# Patient Record
Sex: Female | Born: 1980 | Race: White | Hispanic: No | Smoking: Current every day smoker
Health system: Southern US, Community
[De-identification: ages and names within clinical notes are randomized; demographics above are authoritative.]

## PROBLEM LIST (undated history)

## (undated) DIAGNOSIS — D6861 Antiphospholipid syndrome: Secondary | ICD-10-CM

## (undated) DIAGNOSIS — F909 Attention-deficit hyperactivity disorder, unspecified type: Secondary | ICD-10-CM

## (undated) DIAGNOSIS — R87619 Unspecified abnormal cytological findings in specimens from cervix uteri: Secondary | ICD-10-CM

## (undated) DIAGNOSIS — IMO0002 Reserved for concepts with insufficient information to code with codable children: Secondary | ICD-10-CM

## (undated) HISTORY — DX: Antiphospholipid syndrome: D68.61

## (undated) HISTORY — DX: Unspecified abnormal cytological findings in specimens from cervix uteri: R87.619

## (undated) HISTORY — DX: Reserved for concepts with insufficient information to code with codable children: IMO0002

## (undated) HISTORY — DX: Attention-deficit hyperactivity disorder, unspecified type: F90.9

---
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
## 2001-02-12 ENCOUNTER — Inpatient Hospital Stay (HOSPITAL_COMMUNITY): Admission: AD | Admit: 2001-02-12 | Discharge: 2001-02-12 | Payer: Self-pay | Admitting: *Deleted

## 2001-10-01 ENCOUNTER — Emergency Department (HOSPITAL_COMMUNITY): Admission: EM | Admit: 2001-10-01 | Discharge: 2001-10-02 | Payer: Self-pay | Admitting: Emergency Medicine

## 2002-02-21 ENCOUNTER — Emergency Department (HOSPITAL_COMMUNITY): Admission: EM | Admit: 2002-02-21 | Discharge: 2002-02-21 | Payer: Self-pay | Admitting: Emergency Medicine

## 2003-05-11 ENCOUNTER — Emergency Department (HOSPITAL_COMMUNITY): Admission: EM | Admit: 2003-05-11 | Discharge: 2003-05-11 | Payer: Self-pay | Admitting: Family Medicine

## 2003-07-03 ENCOUNTER — Emergency Department (HOSPITAL_COMMUNITY): Admission: EM | Admit: 2003-07-03 | Discharge: 2003-07-03 | Payer: Self-pay | Admitting: Family Medicine

## 2004-03-23 DIAGNOSIS — D6861 Antiphospholipid syndrome: Secondary | ICD-10-CM

## 2004-03-23 HISTORY — DX: Antiphospholipid syndrome: D68.61

## 2004-04-22 ENCOUNTER — Other Ambulatory Visit: Admission: RE | Admit: 2004-04-22 | Discharge: 2004-04-22 | Payer: Self-pay | Admitting: Obstetrics and Gynecology

## 2004-06-06 ENCOUNTER — Ambulatory Visit (HOSPITAL_COMMUNITY): Admission: RE | Admit: 2004-06-06 | Discharge: 2004-06-06 | Payer: Self-pay | Admitting: Obstetrics and Gynecology

## 2004-08-16 ENCOUNTER — Inpatient Hospital Stay (HOSPITAL_COMMUNITY): Admission: AD | Admit: 2004-08-16 | Discharge: 2004-08-16 | Payer: Self-pay | Admitting: Obstetrics and Gynecology

## 2004-09-09 ENCOUNTER — Ambulatory Visit (HOSPITAL_COMMUNITY): Admission: RE | Admit: 2004-09-09 | Discharge: 2004-09-09 | Payer: Self-pay | Admitting: Obstetrics and Gynecology

## 2004-10-13 ENCOUNTER — Ambulatory Visit (HOSPITAL_COMMUNITY): Admission: RE | Admit: 2004-10-13 | Discharge: 2004-10-13 | Payer: Self-pay | Admitting: Obstetrics and Gynecology

## 2004-10-20 ENCOUNTER — Inpatient Hospital Stay (HOSPITAL_COMMUNITY): Admission: AD | Admit: 2004-10-20 | Discharge: 2004-10-23 | Payer: Self-pay | Admitting: Obstetrics and Gynecology

## 2004-10-20 ENCOUNTER — Ambulatory Visit (HOSPITAL_COMMUNITY): Admission: RE | Admit: 2004-10-20 | Discharge: 2004-10-20 | Payer: Self-pay | Admitting: Obstetrics and Gynecology

## 2004-11-04 ENCOUNTER — Ambulatory Visit: Payer: Self-pay | Admitting: Oncology

## 2004-11-04 ENCOUNTER — Inpatient Hospital Stay (HOSPITAL_COMMUNITY): Admission: AD | Admit: 2004-11-04 | Discharge: 2004-11-06 | Payer: Self-pay | Admitting: Obstetrics and Gynecology

## 2004-11-05 ENCOUNTER — Ambulatory Visit: Payer: Self-pay | Admitting: Oncology

## 2004-11-19 ENCOUNTER — Ambulatory Visit: Payer: Self-pay | Admitting: Oncology

## 2005-01-06 ENCOUNTER — Ambulatory Visit: Payer: Self-pay | Admitting: Oncology

## 2005-02-02 ENCOUNTER — Emergency Department (HOSPITAL_COMMUNITY): Admission: EM | Admit: 2005-02-02 | Discharge: 2005-02-03 | Payer: Self-pay | Admitting: Emergency Medicine

## 2005-02-23 ENCOUNTER — Ambulatory Visit: Payer: Self-pay | Admitting: Oncology

## 2005-04-20 ENCOUNTER — Ambulatory Visit: Payer: Self-pay | Admitting: Oncology

## 2005-04-21 ENCOUNTER — Ambulatory Visit: Payer: Self-pay | Admitting: Family Medicine

## 2005-05-14 ENCOUNTER — Ambulatory Visit (HOSPITAL_COMMUNITY): Admission: RE | Admit: 2005-05-14 | Discharge: 2005-05-14 | Payer: Self-pay | Admitting: Oncology

## 2005-05-18 ENCOUNTER — Ambulatory Visit: Payer: Self-pay | Admitting: Sports Medicine

## 2005-05-20 ENCOUNTER — Ambulatory Visit: Payer: Self-pay | Admitting: Family Medicine

## 2005-05-21 ENCOUNTER — Encounter (INDEPENDENT_AMBULATORY_CARE_PROVIDER_SITE_OTHER): Payer: Self-pay | Admitting: *Deleted

## 2005-06-01 ENCOUNTER — Other Ambulatory Visit: Admission: RE | Admit: 2005-06-01 | Discharge: 2005-06-01 | Payer: Self-pay | Admitting: Obstetrics and Gynecology

## 2005-07-13 ENCOUNTER — Ambulatory Visit: Payer: Self-pay | Admitting: Family Medicine

## 2005-11-05 ENCOUNTER — Ambulatory Visit: Payer: Self-pay | Admitting: Family Medicine

## 2005-11-08 ENCOUNTER — Emergency Department (HOSPITAL_COMMUNITY): Admission: EM | Admit: 2005-11-08 | Discharge: 2005-11-08 | Payer: Self-pay | Admitting: Emergency Medicine

## 2005-12-24 ENCOUNTER — Ambulatory Visit: Payer: Self-pay | Admitting: Family Medicine

## 2006-01-12 ENCOUNTER — Ambulatory Visit: Payer: Self-pay | Admitting: Family Medicine

## 2006-02-22 ENCOUNTER — Ambulatory Visit: Payer: Self-pay | Admitting: Family Medicine

## 2006-02-22 ENCOUNTER — Encounter: Payer: Self-pay | Admitting: Vascular Surgery

## 2006-02-22 ENCOUNTER — Inpatient Hospital Stay (HOSPITAL_COMMUNITY): Admission: RE | Admit: 2006-02-22 | Discharge: 2006-02-24 | Payer: Self-pay | Admitting: Sports Medicine

## 2006-02-22 ENCOUNTER — Ambulatory Visit: Payer: Self-pay | Admitting: Sports Medicine

## 2006-02-26 ENCOUNTER — Ambulatory Visit: Payer: Self-pay | Admitting: Family Medicine

## 2006-03-01 ENCOUNTER — Ambulatory Visit: Payer: Self-pay | Admitting: Family Medicine

## 2006-03-05 ENCOUNTER — Ambulatory Visit: Payer: Self-pay | Admitting: Sports Medicine

## 2006-03-10 ENCOUNTER — Ambulatory Visit: Payer: Self-pay | Admitting: Family Medicine

## 2006-03-12 ENCOUNTER — Ambulatory Visit: Payer: Self-pay | Admitting: Family Medicine

## 2006-03-18 ENCOUNTER — Ambulatory Visit: Payer: Self-pay | Admitting: Family Medicine

## 2006-03-19 ENCOUNTER — Ambulatory Visit: Payer: Self-pay | Admitting: Family Medicine

## 2006-04-05 ENCOUNTER — Ambulatory Visit: Payer: Self-pay | Admitting: Family Medicine

## 2006-04-15 ENCOUNTER — Emergency Department (HOSPITAL_COMMUNITY): Admission: EM | Admit: 2006-04-15 | Discharge: 2006-04-16 | Payer: Self-pay | Admitting: Emergency Medicine

## 2006-04-19 ENCOUNTER — Ambulatory Visit: Payer: Self-pay | Admitting: Family Medicine

## 2006-04-22 ENCOUNTER — Ambulatory Visit: Payer: Self-pay | Admitting: Family Medicine

## 2006-04-26 ENCOUNTER — Ambulatory Visit: Payer: Self-pay | Admitting: *Deleted

## 2006-05-10 ENCOUNTER — Ambulatory Visit: Payer: Self-pay | Admitting: Family Medicine

## 2006-05-17 ENCOUNTER — Ambulatory Visit: Payer: Self-pay | Admitting: Family Medicine

## 2006-05-21 ENCOUNTER — Encounter (INDEPENDENT_AMBULATORY_CARE_PROVIDER_SITE_OTHER): Payer: Self-pay | Admitting: *Deleted

## 2006-05-25 ENCOUNTER — Ambulatory Visit: Payer: Self-pay | Admitting: Family Medicine

## 2006-05-25 DIAGNOSIS — I80299 Phlebitis and thrombophlebitis of other deep vessels of unspecified lower extremity: Secondary | ICD-10-CM

## 2006-05-25 LAB — CONVERTED CEMR LAB: Prothrombin Time: 20.1 s

## 2006-06-07 ENCOUNTER — Ambulatory Visit: Payer: Self-pay | Admitting: Sports Medicine

## 2006-06-22 ENCOUNTER — Ambulatory Visit: Payer: Self-pay | Admitting: Family Medicine

## 2006-07-05 ENCOUNTER — Ambulatory Visit: Payer: Self-pay | Admitting: Family Medicine

## 2006-07-05 LAB — CONVERTED CEMR LAB: INR: 3.7

## 2006-07-15 ENCOUNTER — Ambulatory Visit: Payer: Self-pay | Admitting: Sports Medicine

## 2006-07-15 LAB — CONVERTED CEMR LAB: INR: 1.7

## 2006-07-19 ENCOUNTER — Ambulatory Visit: Payer: Self-pay | Admitting: Family Medicine

## 2006-07-19 ENCOUNTER — Encounter: Payer: Self-pay | Admitting: Family Medicine

## 2006-07-22 ENCOUNTER — Ambulatory Visit: Payer: Self-pay | Admitting: Sports Medicine

## 2006-07-22 ENCOUNTER — Encounter: Payer: Self-pay | Admitting: Family Medicine

## 2006-07-22 LAB — CONVERTED CEMR LAB
INR: 1.9
INR: 1.7 — ABNORMAL HIGH (ref 0.0–1.5)
Prothrombin Time: 20.5 s — ABNORMAL HIGH (ref 11.6–15.2)

## 2006-07-26 ENCOUNTER — Telehealth: Payer: Self-pay | Admitting: *Deleted

## 2006-08-04 ENCOUNTER — Encounter (INDEPENDENT_AMBULATORY_CARE_PROVIDER_SITE_OTHER): Payer: Self-pay | Admitting: *Deleted

## 2006-08-04 ENCOUNTER — Ambulatory Visit: Payer: Self-pay | Admitting: *Deleted

## 2006-08-06 ENCOUNTER — Ambulatory Visit (HOSPITAL_COMMUNITY): Admission: RE | Admit: 2006-08-06 | Discharge: 2006-08-06 | Payer: Self-pay | Admitting: Family Medicine

## 2006-08-09 ENCOUNTER — Ambulatory Visit: Payer: Self-pay | Admitting: *Deleted

## 2006-08-09 ENCOUNTER — Ambulatory Visit (HOSPITAL_COMMUNITY): Admission: RE | Admit: 2006-08-09 | Discharge: 2006-08-09 | Payer: Self-pay | Admitting: Family Medicine

## 2006-08-17 ENCOUNTER — Ambulatory Visit (HOSPITAL_COMMUNITY): Admission: RE | Admit: 2006-08-17 | Discharge: 2006-08-17 | Payer: Self-pay | Admitting: Gynecology

## 2006-08-17 ENCOUNTER — Encounter (INDEPENDENT_AMBULATORY_CARE_PROVIDER_SITE_OTHER): Payer: Self-pay | Admitting: Gynecology

## 2006-08-17 ENCOUNTER — Ambulatory Visit: Payer: Self-pay | Admitting: Gynecology

## 2006-09-19 IMAGING — US US OB LIMITED
1 series · 13 of 28 positions shown · non-contrast
Comparison: none

CLINICAL DATA: Assess biophysical profile and amniotic fluid index.

[Series 1: us ob limited · 0.33mm/px · 13 of 34 slices shown]
[im 2/34]
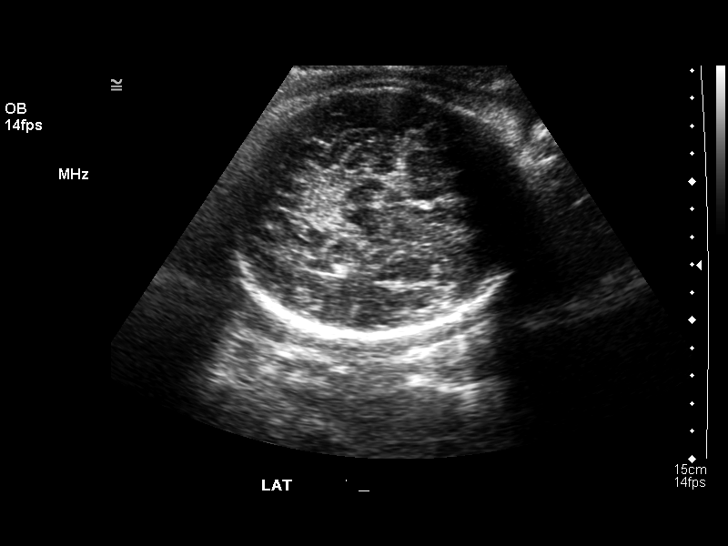
[im 4/34]
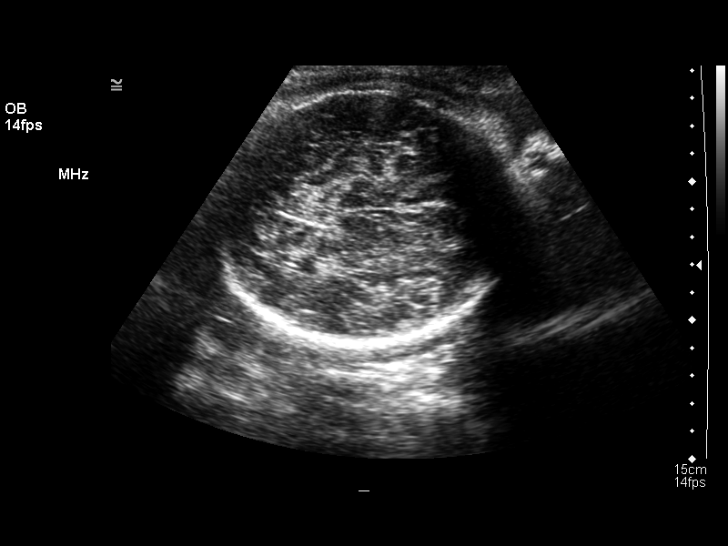
[im 7/34]
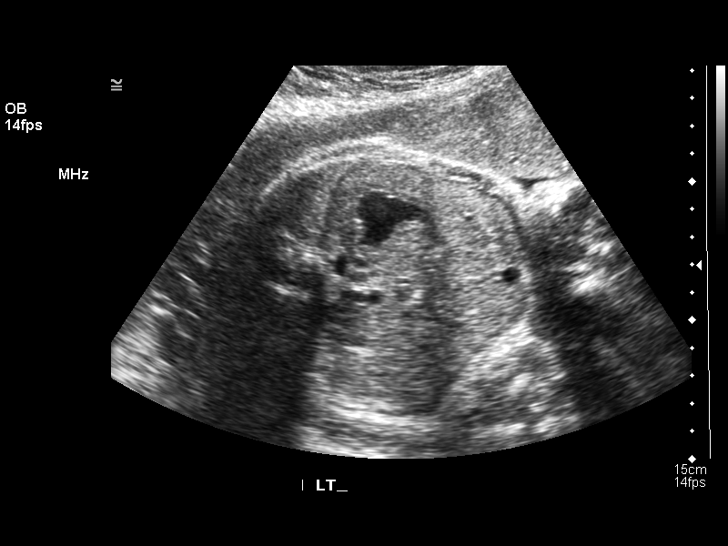
[im 9/34]
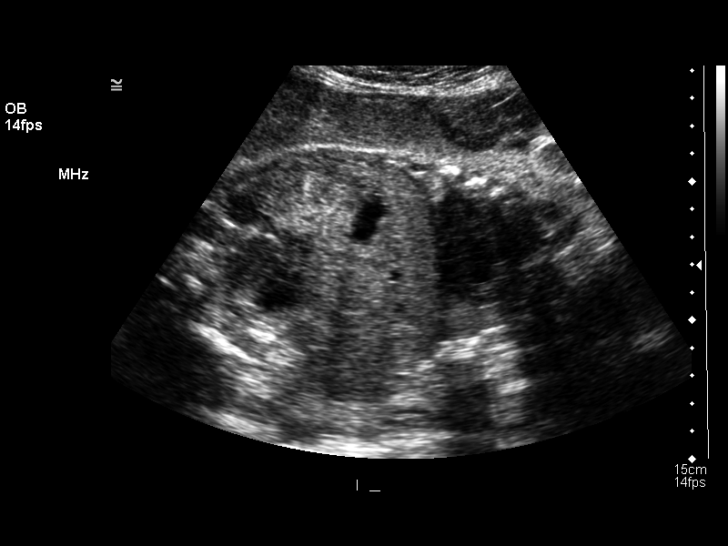
[im 12/34]
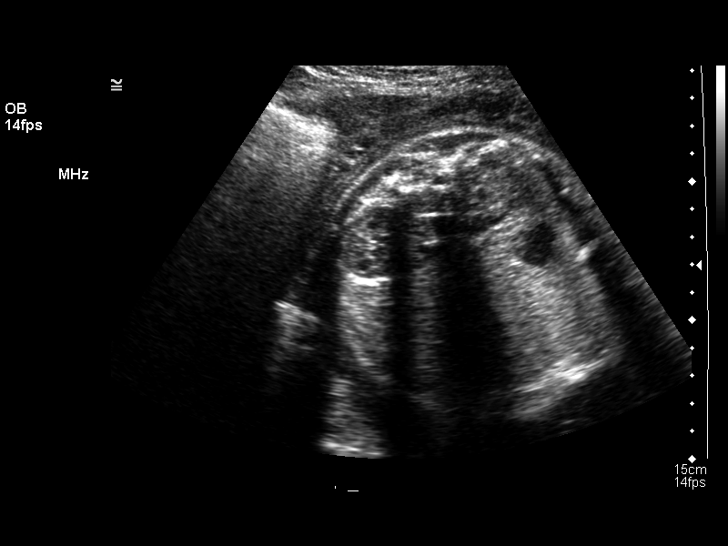
[im 14/34]
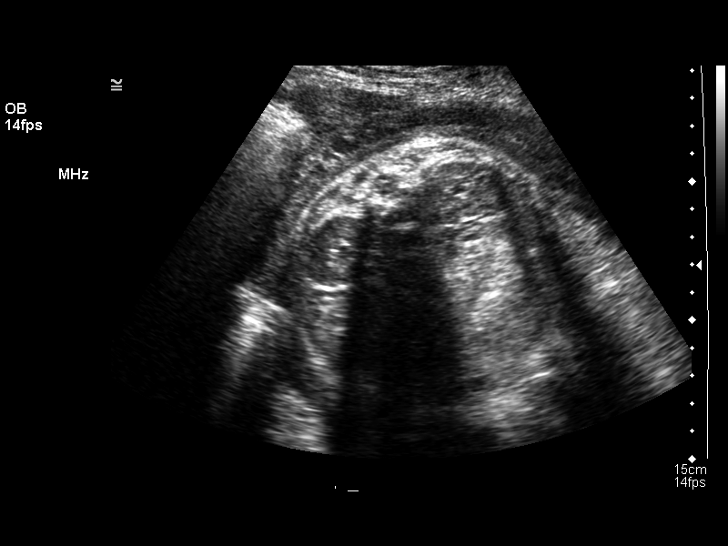
[im 18/34]
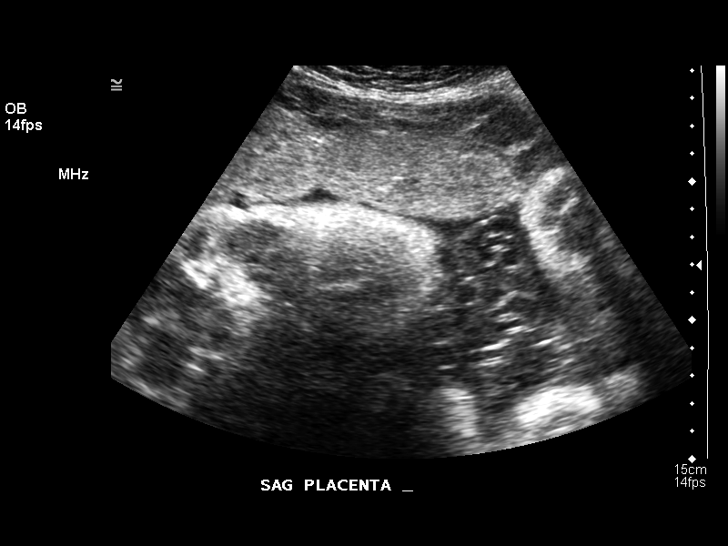
[im 20/34]
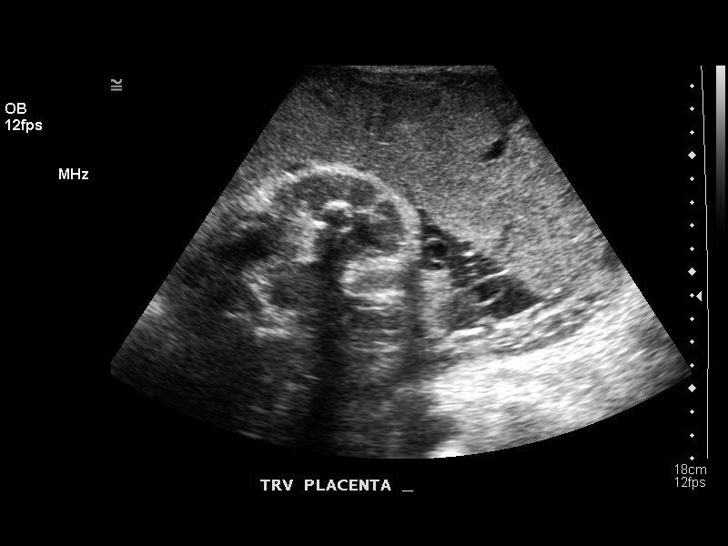
[im 23/34]
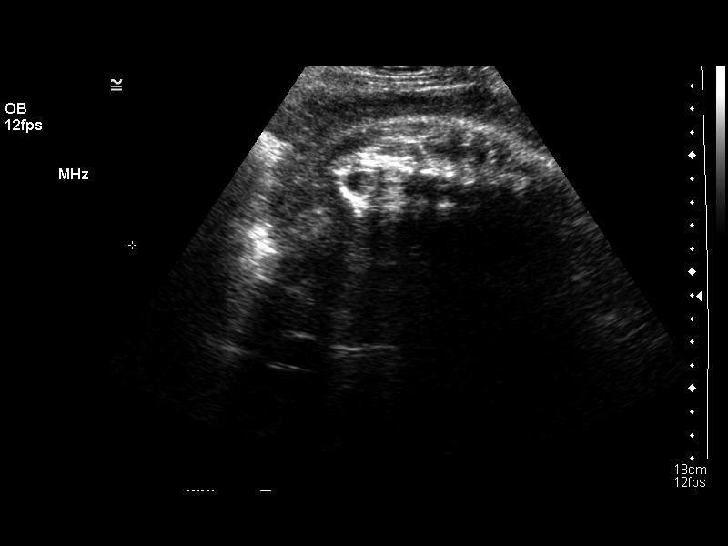
[im 25/34]
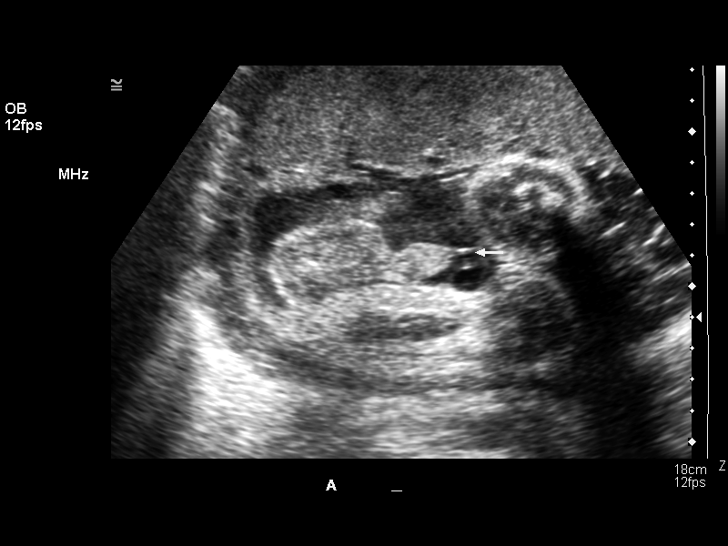
[im 27/34]
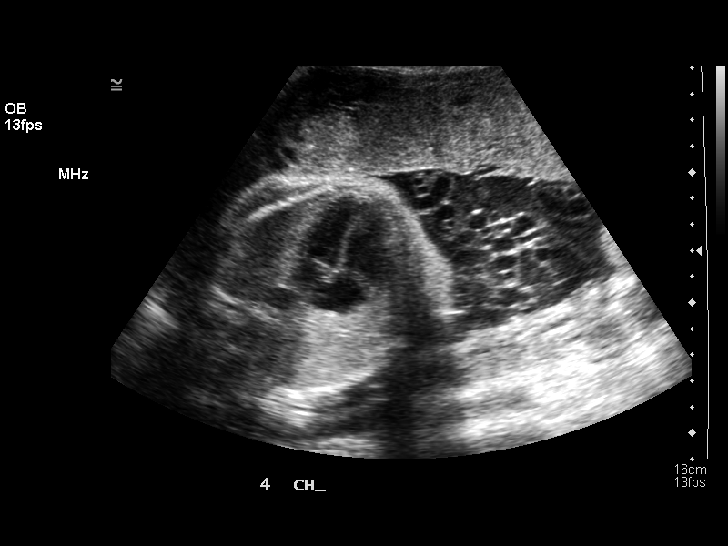
[im 30/34]
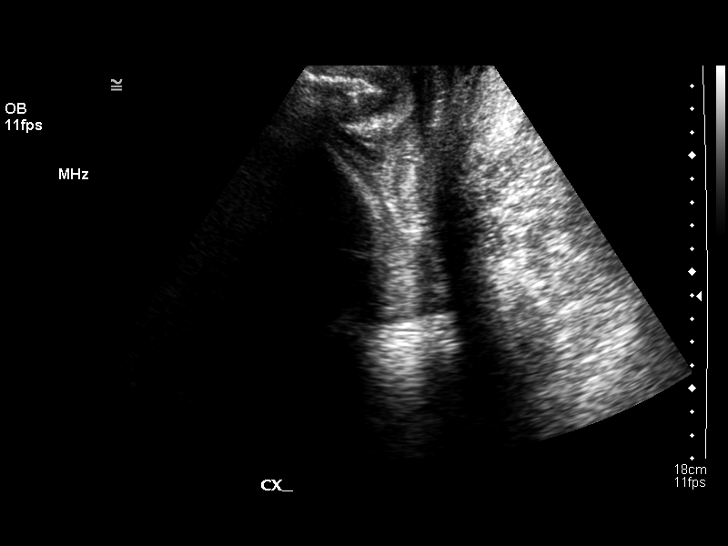
[im 32/34]
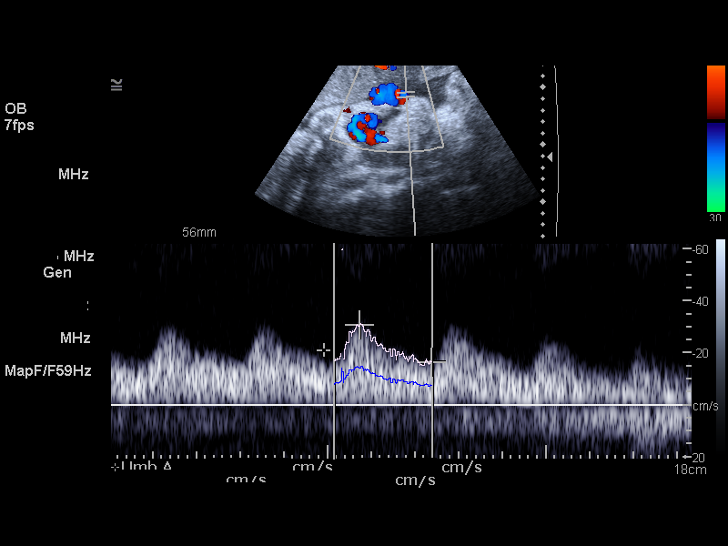

[13 of 28 positions shown; findings below may reference images not displayed]

LIMITED OBSTETRICAL ULTRASOUND:
Number of Fetuses: 1
Heart Rate:  136
Movement:  Yes
Breathing:  Yes
Presentation:  Cephalic
Placental Location:  Anterior
Grade:  I
Previa:  No
Amniotic Fluid (Subjective): Decreased
Amniotic Fluid (Objective):  7.7 cm AFI (5th -95th%ile = 7.7 ? 24.9 cm for 36 wks)

Fetal measurements and complete anatomic evaluation were not requested.  The following fetal anatomy was visualized during this exam:  Lateral ventricles, thalami, four chamber heart, stomach, kidneys, and bladder.  

MATERNAL UTERINE AND ADNEXAL FINDINGS
Cervix:  3.0 cm Translabially

BIOPHYSICAL PROFILE

Movement:  2      Time:  20 minutes 
Breathing:  2
Tone:  2
Amniotic Fluid:  2

Total Score:  8

DOPPLER ULTRASOUND OF FETUS:

Umbilical Artery S/D Ratio:  1.80    (NL< 3.33)
IMPRESSION: 1.  Subjectively and quantitatively slightly decreased amniotic fluid with an amniotic fluid index measured right at the 5th percentile for a 36 week gestation at 7.7 cm.  Because of today?s amniotic fluid volume, an umbilical artery S/D ratio was performed.
2.  Biophysical profile score [DATE].
3.  Normal umbilical artery S/D ratio.  No absence or reversal of end diastolic flow was appreciated.  
4.  No late developing fetal anatomic abnormalities are identified associated with the lateral ventricles, four chamber heart, stomach, kidneys or bladder.  
5.  Normal cervical length.

## 2006-09-22 ENCOUNTER — Ambulatory Visit: Payer: Self-pay | Admitting: Obstetrics & Gynecology

## 2006-09-29 ENCOUNTER — Ambulatory Visit: Payer: Self-pay | Admitting: Obstetrics & Gynecology

## 2006-12-24 ENCOUNTER — Encounter: Payer: Self-pay | Admitting: Family Medicine

## 2006-12-24 ENCOUNTER — Ambulatory Visit: Payer: Self-pay | Admitting: Family Medicine

## 2006-12-24 ENCOUNTER — Telehealth (INDEPENDENT_AMBULATORY_CARE_PROVIDER_SITE_OTHER): Payer: Self-pay | Admitting: *Deleted

## 2006-12-24 DIAGNOSIS — R5381 Other malaise: Secondary | ICD-10-CM | POA: Insufficient documentation

## 2006-12-24 DIAGNOSIS — R5383 Other fatigue: Secondary | ICD-10-CM

## 2006-12-24 LAB — CONVERTED CEMR LAB
HCT: 36.6 % (ref 36.0–46.0)
Platelets: 192 10*3/uL (ref 150–400)
RDW: 11.7 % (ref 11.5–14.0)
TSH: 0.64 microintl units/mL (ref 0.350–5.50)
WBC: 6.6 10*3/uL (ref 4.0–10.5)

## 2007-01-10 ENCOUNTER — Ambulatory Visit: Payer: Self-pay | Admitting: Sports Medicine

## 2007-01-10 DIAGNOSIS — F17200 Nicotine dependence, unspecified, uncomplicated: Secondary | ICD-10-CM

## 2007-01-13 ENCOUNTER — Encounter: Payer: Self-pay | Admitting: *Deleted

## 2007-01-17 ENCOUNTER — Encounter (INDEPENDENT_AMBULATORY_CARE_PROVIDER_SITE_OTHER): Payer: Self-pay | Admitting: *Deleted

## 2007-07-01 ENCOUNTER — Emergency Department (HOSPITAL_COMMUNITY): Admission: EM | Admit: 2007-07-01 | Discharge: 2007-07-01 | Payer: Self-pay | Admitting: Emergency Medicine

## 2007-10-06 ENCOUNTER — Encounter: Payer: Self-pay | Admitting: Obstetrics and Gynecology

## 2007-10-06 ENCOUNTER — Ambulatory Visit: Payer: Self-pay | Admitting: Obstetrics and Gynecology

## 2007-10-10 ENCOUNTER — Ambulatory Visit (HOSPITAL_COMMUNITY): Admission: RE | Admit: 2007-10-10 | Discharge: 2007-10-10 | Payer: Self-pay | Admitting: Family Medicine

## 2007-11-16 ENCOUNTER — Ambulatory Visit: Payer: Self-pay | Admitting: Obstetrics and Gynecology

## 2007-11-17 ENCOUNTER — Other Ambulatory Visit: Admission: RE | Admit: 2007-11-17 | Discharge: 2007-11-17 | Payer: Self-pay | Admitting: Gynecology

## 2007-11-30 ENCOUNTER — Ambulatory Visit: Payer: Self-pay | Admitting: Obstetrics & Gynecology

## 2008-02-01 ENCOUNTER — Ambulatory Visit: Payer: Self-pay | Admitting: Obstetrics & Gynecology

## 2008-03-12 ENCOUNTER — Emergency Department (HOSPITAL_COMMUNITY): Admission: EM | Admit: 2008-03-12 | Discharge: 2008-03-13 | Payer: Self-pay | Admitting: Emergency Medicine

## 2008-03-22 IMAGING — CT CT ANGIO CHEST
3 of 5 series · 18 of 30 positions shown · IV contrast (100 ML OMNI 300)
Comparison: none

CLINICAL DATA: Chest pain and shortness of breath.  Lower extremity DVT.  Clinical suspicion pulmonary embolism.  
 CT ANGIOGRAPHY OF CHEST:
TECHNIQUE: Multidetector CT imaging of the chest was performed during bolus injection of intravenous contrast.  Multiplanar CT angiographic image reconstructions were generated to evaluate the vascular anatomy.
 Contrast:  100 cc Omnipaque 300.

[Series 2: pe · axial · 0.73mm/px · z∈[-271,-40]mm · 9 of 239 slices shown]
[im 27/239  lung]
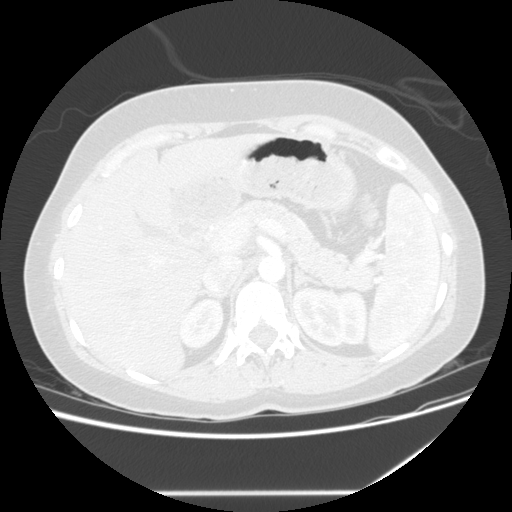
[im 53/239  mediastinal]
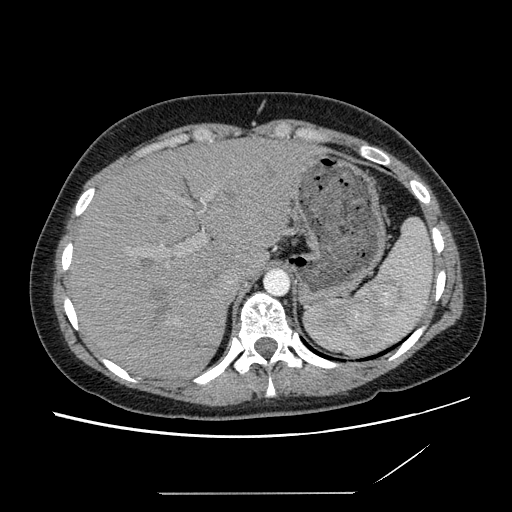
[im 80/239  lung]
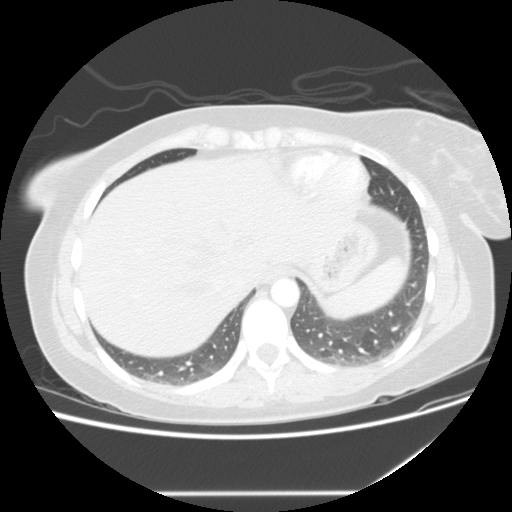
[im 106/239  mediastinal]
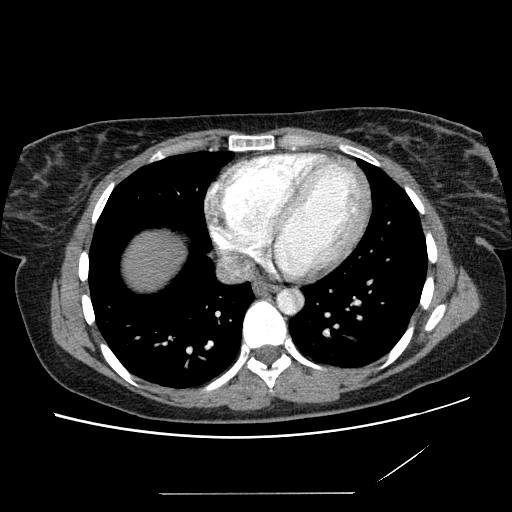
[im 127/239  lung]
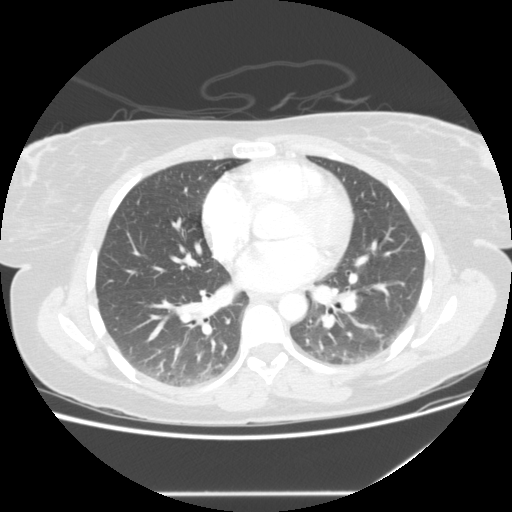
[im 133/239  mediastinal]
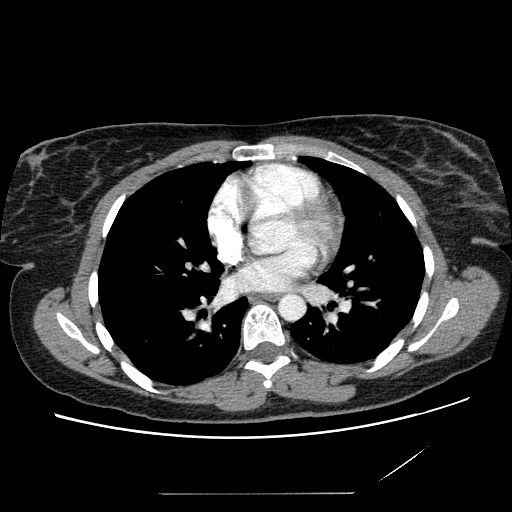
[im 159/239  lung]
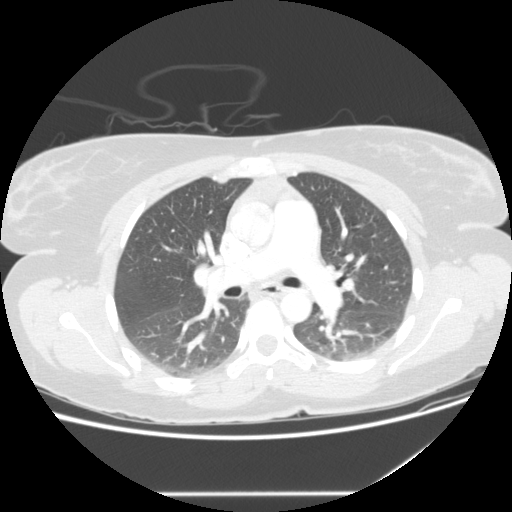
[im 186/239  mediastinal]
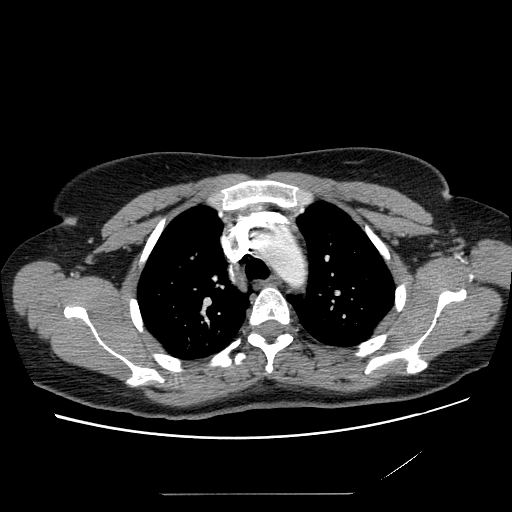
[im 212/239  lung]
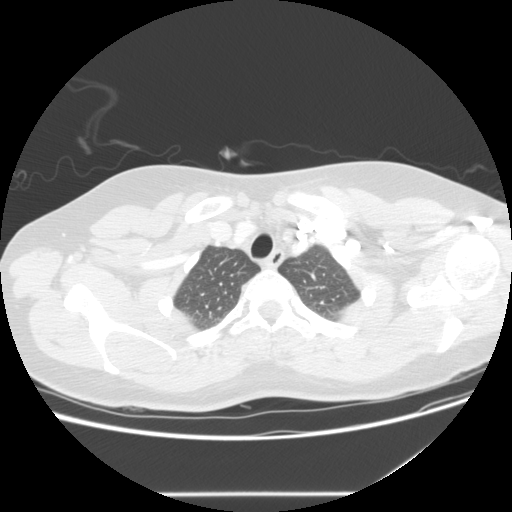

[Series 3: recon 2: pe · axial · 0.73mm/px · z∈[-231,-81]mm · 4 of 120 slices shown]
[im 30/120  lung]
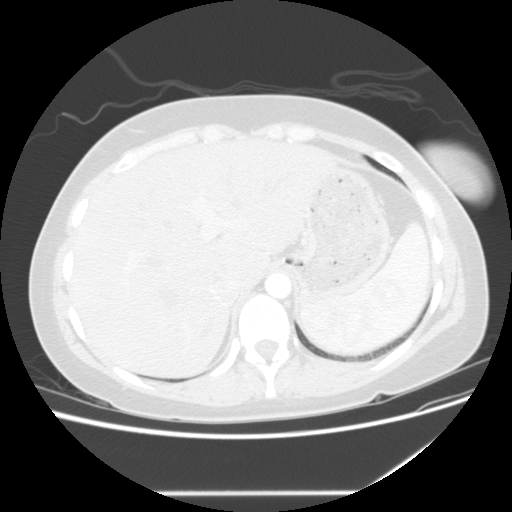
[im 60/120  lung]
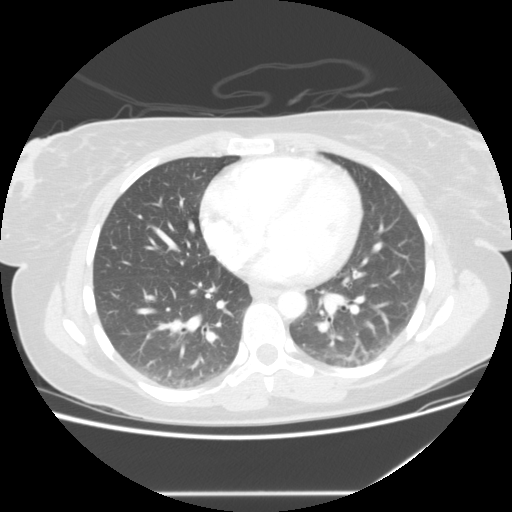
[im 64/120  lung]
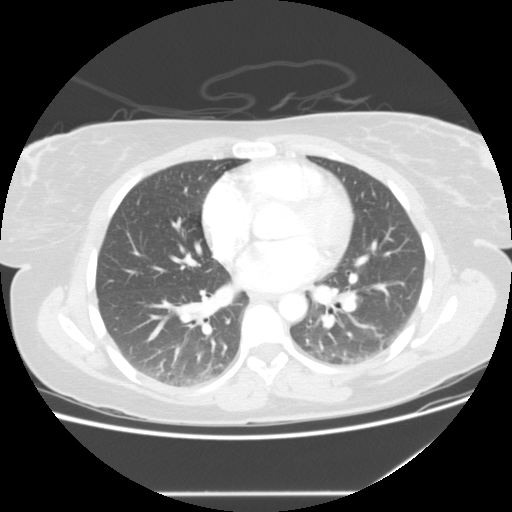
[im 90/120  lung]
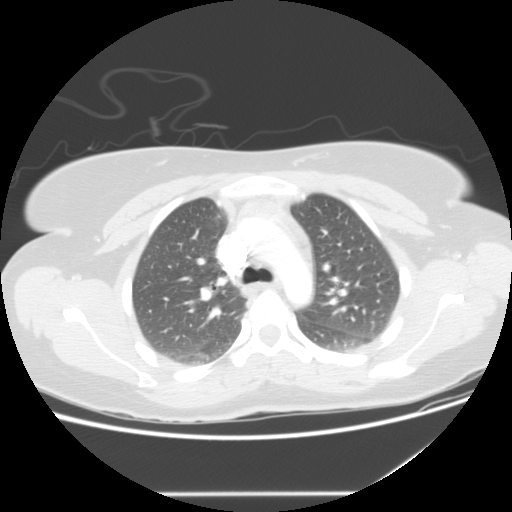

[Series 201: reformatted · sagittal · 0.73mm/px · 5 of 165 slices shown]
[im 28/165  lung]
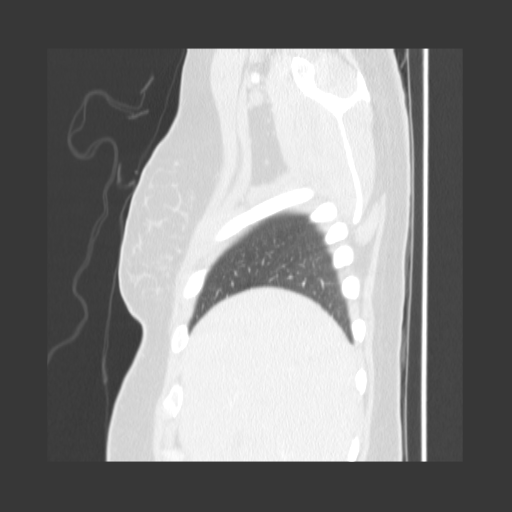
[im 55/165  lung]
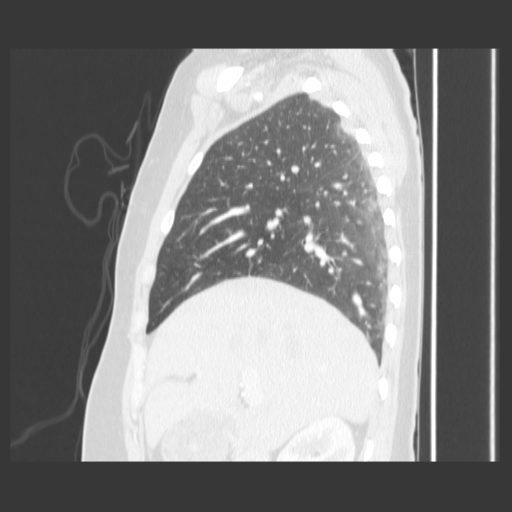
[im 83/165  lung]
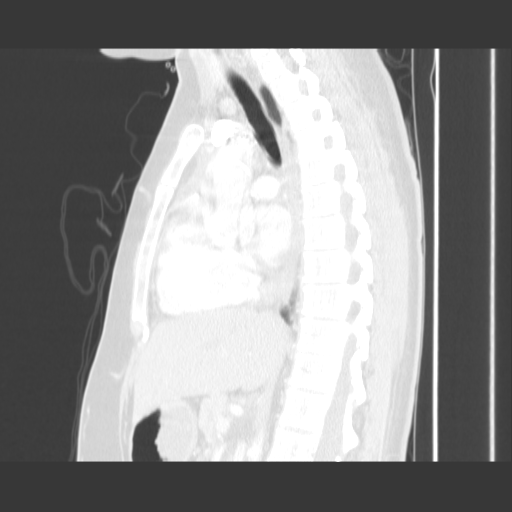
[im 110/165  lung]
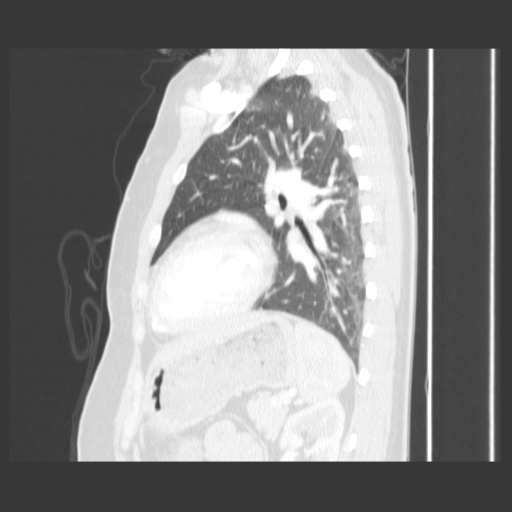
[im 137/165  lung]
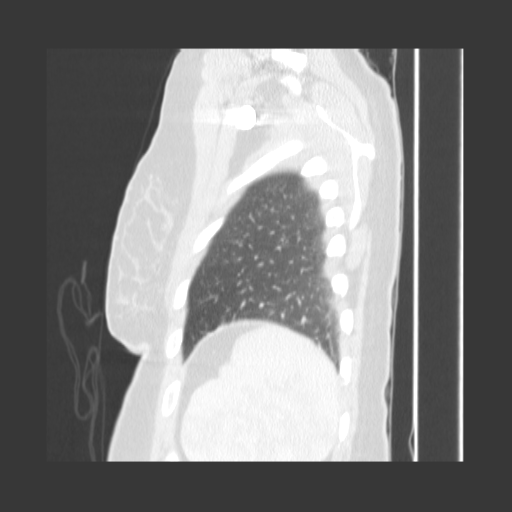

[18 of 30 positions shown; findings below may reference images not displayed]

FINDINGS: Satisfactory opacification of the pulmonary arteries is seen, and there is no evidence of pulmonary embolism.  There is no evidence of thoracic aortic aneurysm or dissection.  There is no evidence of hilar or mediastinal masses.
 There is no evidence of pleural or pericardial effusion.  Both lungs are clear.  There is no evidence of infiltrate or mass.
IMPRESSION: No evidence of pulmonary embolism.  No active disease.

## 2008-06-06 ENCOUNTER — Ambulatory Visit: Payer: Self-pay | Admitting: Obstetrics & Gynecology

## 2008-06-06 ENCOUNTER — Encounter: Payer: Self-pay | Admitting: Obstetrics and Gynecology

## 2008-06-06 ENCOUNTER — Encounter: Payer: Self-pay | Admitting: Obstetrics & Gynecology

## 2008-06-06 LAB — CONVERTED CEMR LAB
HCV Ab: NEGATIVE
Trich, Wet Prep: NONE SEEN
Yeast Wet Prep HPF POC: NONE SEEN

## 2009-02-14 ENCOUNTER — Inpatient Hospital Stay (HOSPITAL_COMMUNITY): Admission: AD | Admit: 2009-02-14 | Discharge: 2009-02-14 | Payer: Self-pay | Admitting: Obstetrics and Gynecology

## 2009-08-30 ENCOUNTER — Emergency Department (HOSPITAL_COMMUNITY): Admission: EM | Admit: 2009-08-30 | Discharge: 2009-08-31 | Payer: Self-pay | Admitting: Emergency Medicine

## 2009-09-03 ENCOUNTER — Inpatient Hospital Stay (HOSPITAL_COMMUNITY): Admission: AD | Admit: 2009-09-03 | Discharge: 2009-09-03 | Payer: Self-pay | Admitting: Obstetrics & Gynecology

## 2009-09-03 ENCOUNTER — Ambulatory Visit: Payer: Self-pay | Admitting: Nurse Practitioner

## 2009-09-06 ENCOUNTER — Ambulatory Visit: Payer: Self-pay | Admitting: Family Medicine

## 2009-09-06 ENCOUNTER — Encounter: Payer: Self-pay | Admitting: Family Medicine

## 2009-09-06 DIAGNOSIS — M2550 Pain in unspecified joint: Secondary | ICD-10-CM | POA: Insufficient documentation

## 2009-09-06 DIAGNOSIS — D6859 Other primary thrombophilia: Secondary | ICD-10-CM

## 2009-09-06 LAB — CONVERTED CEMR LAB
Albumin: 4.3 g/dL (ref 3.5–5.2)
Anti Nuclear Antibody(ANA): POSITIVE — AB
BUN: 17 mg/dL (ref 6–23)
CO2: 22 meq/L (ref 19–32)
Glucose, Bld: 87 mg/dL (ref 70–99)
Hemoglobin: 12.6 g/dL (ref 12.0–15.0)
MCV: 92 fL (ref 78.0–100.0)
RBC: 4 M/uL (ref 3.87–5.11)
Rhuematoid fact SerPl-aCnc: <20 intl units/mL (ref 0–20)
Sodium: 139 meq/L (ref 135–145)
TSH: 0.671 microintl units/mL (ref 0.350–4.500)
Total Bilirubin: 0.4 mg/dL (ref 0.3–1.2)
Total Protein: 7.2 g/dL (ref 6.0–8.3)
WBC: 6.3 10*3/uL (ref 4.0–10.5)

## 2009-09-09 ENCOUNTER — Encounter: Payer: Self-pay | Admitting: Family Medicine

## 2010-02-11 ENCOUNTER — Ambulatory Visit: Payer: Self-pay | Admitting: Family Medicine

## 2010-02-11 ENCOUNTER — Encounter: Payer: Self-pay | Admitting: *Deleted

## 2010-02-11 DIAGNOSIS — N10 Acute tubulo-interstitial nephritis: Secondary | ICD-10-CM

## 2010-02-11 LAB — CONVERTED CEMR LAB
Glucose, Urine, Semiquant: NEGATIVE
Ketones, urine, test strip: NEGATIVE
Protein, U semiquant: >=300
Urobilinogen, UA: 0.2

## 2010-02-12 ENCOUNTER — Encounter: Payer: Self-pay | Admitting: Family Medicine

## 2010-03-03 ENCOUNTER — Ambulatory Visit: Payer: Self-pay

## 2010-03-26 ENCOUNTER — Ambulatory Visit: Admit: 2010-03-26 | Payer: Self-pay

## 2010-04-13 ENCOUNTER — Encounter: Payer: Self-pay | Admitting: Obstetrics and Gynecology

## 2010-04-22 NOTE — Assessment & Plan Note (Signed)
Summary: uti?,df   Vital Signs:  Patient profile:   30 year old female Weight:      170.3 pounds Temp:     98.9 degrees F oral Pulse rate:   82 / minute BP sitting:   116 / 77  (left arm) Cuff size:   regular  Vitals Entered By: Jimmy Footman, CMA (February 11, 2010 10:46 AM) CC: UTI sxs x2days Is Patient Diabetic? No Pain Assessment Patient in pain? yes        Primary Care Provider:  . [facility]  CC:  UTI sxs x2days.  History of Present Illness: 30 yo here for work in appt for 2 day history of urinary symptoms  fever, bilateral flank pain, urinary frequency, dysuria.  No abd pain, some lower pelvic pain.  NO recent abd use, no history of frequent UTI's.    Habits & Providers  Alcohol-Tobacco-Diet     Tobacco Status: current  Physical Exam  General:  Well-developed,well-nourished,in no acute distress; alert,appropriate and cooperative throughout examination Abdomen:  Bowel sounds positive,abdomen soft and non-tender.  Mild pelvic pain, moderate bilateral flank pain.   Impression & Recommendations:  Problem # 1:  PYELONEPHRITIS, ACUTE (ICD-590.10) will treat with 14 days of keflex + pyridium + vicodin as needed.  Will send for culture.   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  Discussed with her taht I felt it was important to be on antioagulation.  She did not want to start today, but agreed to make return visit to establish with PCP.  Orders: [facility]- Est Level  3 ([address])  Complete Medication List: 1)  Aspirin 81 Mg Tbec (Aspirin) .... Daily 2)  Cephalexin 500 Mg Caps (Cephalexin) .... One tab three times a day for 14 days 3)  Pyridium 100 Mg Tabs (Phenazopyridine hcl) .... One tablet three times a day for urinary pain 4)  Vicodin 5-500 Mg Tabs (Hydrocodone-acetaminophen) .... One tablet every 6 hours as needed for pain  Other  Orders: Urinalysis-[facility] (00000)  Patient Instructions: 1)  return if youre having worsening pain, fever, unable to keep down liquids 2)  Make appointment with Dr, Denyse Amass or Alvester Morin to discuss your health Prescriptions: PYRIDIUM 100 MG TABS (PHENAZOPYRIDINE HCL) one tablet three times a day for urinary pain  #30 x 0   Entered and Authorized by:   [name] MD   Signed by:   [name] MD on 02/11/2010   Method used:   Print then Give to Patient   RxID:   [id number] VICODIN 5-500 MG TABS (HYDROCODONE-ACETAMINOPHEN) one tablet every 6 hours as needed for pain  #15 x 0   Entered and Authorized by:   [name] MD   Signed by:   [name] MD on 02/11/2010   Method used:   Print then Give to Patient   RxID:   [id number] CEPHALEXIN 500 MG CAPS (CEPHALEXIN) one tab three times a day for 14 days  #42 x 0   Entered and Authorized by:   [name] MD   Signed by:   [name] MD on 02/11/2010   Method used:   Electronically to        [facility] [phone]* (retail)       [address]       [place], [place]  [address]       Ph: [phone]  Fax: [phone]   RxID:   [id number]    Orders Added: 1)  Urinalysis-[facility] [00000] 2)  Urine Culture-[facility] [[id number]] 3)  [facility]- Est Level  3 [53664]    Laboratory Results   Urine Tests  Date/Time Received: February 11, 2010 10:30 AM  Date/Time Reported: February 11, 2010 10:55 AM   Routine Urinalysis   Color: yellow Appearance: Cloudy Glucose: negative   (Normal Range: Negative) Bilirubin: negative   (Normal Range: Negative) Ketone: negative   (Normal Range: Negative) Spec. Gravity: 1.025   (Normal Range: 1.003-1.035) Blood: large   (Normal Range: Negative) pH: 6.0   (Normal Range: 5.0-8.0) Protein: >=300   (Normal Range: Negative) Urobilinogen: 0.2   (Normal Range: 0-1) Nitrite: positive   (Normal Range: Negative) Leukocyte Esterace: moderate   (Normal Range: Negative)  Urine Microscopic WBC/HPF:  20+ RBC/HPF: 10-15 Bacteria/HPF: 2+ Epithelial/HPF: 1-5    Comments: ...........test performed by...........Marland KitchenTerese Door, CMA

## 2010-04-22 NOTE — Letter (Signed)
Summary: Out of School  [facility]  [address]   [place], [place] [address]   Phone: [phone]  Fax: [phone]    February 11, 2010   Student:  Valerie Higgins    To Whom It May Concern:   For Medical reasons, please excuse the above named student from school for the following dates:  February 11, 2010  If you need additional information, please feel free to contact our office.   Sincerely,    Jimmy Footman, CMA for [name], MD    ****This is a legal document and cannot be tampered with.  Schools are authorized to verify all information and to do so accordingly.

## 2010-04-22 NOTE — Letter (Signed)
Summary: Letter was not mailed to pt  [facility]  [address]   [place], [place] [address]   Phone: [phone]  Fax: [phone]    09/09/2009  Valerie Higgins [address] [place], [place]  [address]  Dear Ms. Arduini,  All of your labs are normal, there is no indication of any chronic disease that concerned you.  Please follow up as needed.  It is known that you do have antiphospholipid antibody condition that warrants long term treatment wtih coumadin.  I recommend you resume you follow up with your hematologist for this serious condition.  If you would have any symptoms of a clot in your leg, you should seek medical attention immediately.     Sincerely,   Luretha Murphy NP  Appended Document: Generic Letter mailed  Appended Document: Generic Letter letter NOT mailed per Trey Sailors request
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
## 2010-05-14 ENCOUNTER — Encounter: Payer: Self-pay | Admitting: *Deleted

## 2010-06-09 LAB — URINALYSIS, ROUTINE W REFLEX MICROSCOPIC
Glucose, UA: NEGATIVE mg/dL
Ketones, ur: NEGATIVE mg/dL
Leukocytes, UA: NEGATIVE
Specific Gravity, Urine: >1.03 — ABNORMAL HIGH (ref 1.005–1.030)
pH: 5.5 (ref 5.0–8.0)

## 2010-06-09 LAB — URINE MICROSCOPIC-ADD ON

## 2010-06-25 LAB — WET PREP, GENITAL: Trich, Wet Prep: NONE SEEN

## 2010-06-25 LAB — GC/CHLAMYDIA PROBE AMP, GENITAL: Chlamydia, DNA Probe: NEGATIVE

## 2010-06-25 LAB — POCT PREGNANCY, URINE: Preg Test, Ur: NEGATIVE
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
## 2010-08-05 NOTE — Group Therapy Note (Signed)
NAMELENORE, MOYANO NO.:  [id number]   MEDICAL RECORD NO.:  [id number]          PATIENT TYPE:  WOC   LOCATION[facility]  [facility]                   [facility]ACILITY:  [facility]   PHYSICIAN:  Elsie Lincoln, MD      DATE OF BIRTH:  Oct 24, 1980   DATE OF SERVICE:  09/22/2006                                  CLINIC NOTE   The patient is a 30 year old female who has antiphosphal lipid syndrome  and has had a blot clot in the past.  She also has a prothrombin gene  mutation.  The patient had termination of pregnancy due to her severe  medical condition.  Today she is here for IUD.  The patient was  consented for the procedure.  She also wanted the skin tags removed from  her labia which we are going to do today as well.  The patient also of  note is not taking her Coumadin and we expressed this is extremely  necessary given the severity of her condition and history of clots, she  will try to do this, although without having medicated her for her to  get the blood draw.   PROCEDURE:  The cervix was cleaned and the uterus sounded to 9 cm.  The  IUD was easily launched.  The strings were initially cut at 3 cm,  however the strings were sticking straight out so we cut them down to a  little less than 1, essentially her partner will not feel this.  As for  her 3 skin tags, one was cut off and coagulated with silver nitrate.  The other 2 were tied off.  She will come back in a week to reevaluate  these.   ASSESSMENT AND PLAN:  48. A 30 year old female with antiphosphal lipid syndrome and      prothrombin gene mutation for __________ intrauterine device      insertion which was done.  2. Come back in a week for evaluation of the skin lesions that we      removed.           ______________________________  Elsie Lincoln, MD     KL/MEDQ  D:  09/22/2006  T:  09/23/2006  Job:  [id number]

## 2010-08-05 NOTE — Group Therapy Note (Signed)
Valerie Higgins, LAUBACH NO.:  [id number]   MEDICAL RECORD NO.:  [id number]          PATIENT TYPE:  WOC   LOCATION:  [facility]                   [facility]   PHYSICIAN:  Elsie Lincoln, MD      DATE OF BIRTH:  08/27/1980   DATE OF SERVICE:  09/29/2006                                  CLINIC NOTE   REASON FOR VISIT:  The patient returns for followup of IUD.  She has  been having abdominal cramping.  Ultrasound reveals the IUD inside the  uterus in good position near the fundus.  She also had two skin tags  that were tied off with Vicryl that have not fallen off and have  actually gotten more swollen.  I elected to remove these.  Xylocaine 1%  was injected into the area and these were easily clipped off and  hemostasis achieved with Silver nitrate.  The patient is up-to-date on  Pap smear and will need one in May 2009.  The patient explained about  the short strings and it may prove for difficult removal, however, she  has good contraception which is important at this point.  The patient  will be seen back in May.           ______________________________  Elsie Lincoln, MD     KL/MEDQ  D:  09/29/2006  T:  09/30/2006  Job:  [id number]

## 2010-08-05 NOTE — Group Therapy Note (Signed)
NAMESUHEYLA, MORTELLARO NO.:  [id number]   MEDICAL RECORD NO.:  [id number]          PATIENT TYPE:  WOC   [facility]ATION[facility]                   [facility]   PHYSICIAN:  Elsie Lincoln, MD      DATE OF BIRTH:  06-15-1980   DATE OF SERVICE:  06/06/2008                                  CLINIC NOTE   The patient is a 30 year old female who presents for her first follow-up  Pap smear from a CIN one biopsy.  The patient also presents for a third  Gardasil shot, and the patient requests STD testing.  The patient  reports no other problems or complaints.   PROCEDURE:  Pap smear was taken.  GC, chlamydia and wet prep sent.   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  1. Pap smear.  2. STD screening which will do gonorrhea, chlamydia, wet prep for      Trichomonas, hepatitis B and C, HIV and syphilis.  3. Gardasil, last injection today.  4. Return to clinic in 6 months.           ______________________________  Elsie Lincoln, MD     KL/MEDQ  D:  06/06/2008  T:  06/07/2008  Job:  [id number]

## 2010-08-05 NOTE — Op Note (Signed)
Valerie Higgins, Valerie Higgins                 ACCOUNT NO.:  [id number]   MEDICAL RECORD NO.:  [id number]          PATIENT TYPE:  AMB   LOCATION:  [facility]                           [facility]   PHYSICIAN:  Ginger Carne, MD  DATE OF BIRTH:  [birth year]   DATE OF PROCEDURE:  08/17/2006  DATE OF DISCHARGE:                               OPERATIVE REPORT   PREOPERATIVE DIAGNOSIS:  8+ weeks voluntary termination of pregnancy due  to life-threatening medical condition.   POSTOPERATIVE DIAGNOSIS:  8+ weeks voluntary termination of pregnancy  due to life-threatening medical condition.   PROCEDURE:  Aspiration, dilation extraction.   SURGEON:  Ginger Carne, M.D.   ASSISTANT:  None.   COMPLICATIONS:  None immediate.   ESTIMATED BLOOD LOSS:  Minimal.   SPECIMEN:  Products of conception.   ANESTHESIA:  General.   OPERATIVE FINDINGS:  External genitalia, vulva and vagina normal.  Cervix smooth without erosions or lesions.  Uterus was 8 weeks in size,  globular and sounded to same.  Both adnexa palpable found to be normal.  Products of conception noted.  She is Rh negative and will receive  RhoGAM postoperatively.   OPERATIVE PROCEDURE:  The patient prepped and draped in usual fashion  and placed in lithotomy position.  Betadine solution used for  antiseptic.  The patient was catheterized prior to procedure.  After  adequate general anesthesia a tenaculum placed on the anterior lip of  the cervix. Dilatation to accommodate #8 suction curettage followed by  suctioning sharp and suction curettage.  Specimens sent to pathology.  Uterus cleaned the end of the procedure.  The patient tolerated the  procedure well, returned post anesthesia recovery room in excellent  condition.      Ginger Carne, MD  Electronically Signed     SHB/MEDQ  D:  08/17/2006  T:  08/17/2006  Job:  [id number]
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
## 2010-08-08 NOTE — Discharge Summary (Signed)
NAMEMECHELE, KITTLESON                 ACCOUNT NO.:  [id number]   MEDICAL RECORD NO.:  [id number]          PATIENT TYPE:  INP   LOCATION:  5711                         FACILITY:  [facility]   PHYSICIAN:  Norton Blizzard, M.D.    DATE OF BIRTH:  09/13/1980   DATE OF ADMISSION:  02/22/2006  DATE OF DISCHARGE:  02/24/2006                               DISCHARGE SUMMARY   DISCHARGE DIAGNOSES:  1. Right lower extremity deep venous thrombosis.  2. Antiphospholipid antibody syndrome.  3. Prothrombin II gene mutation.  4. Headache.   CONSULTS:  1. Heme-onc phone consultation with Dr. Darnelle Catalan.  2. Child psychotherapist.  3. Pharmacy.   PROCEDURE:  Bilateral lower extremity Doppler ultrasound which showed a  right occlusive DVT 7 inches above the knee into the popliteal and calf  veins.  There was collateral filling just proximal to the DVT.   ADMIT HISTORY AND PHYSICAL:  See full H&P from December 03 for complete  details, but in brief, the is a 30 year old female with a history of a  left lower extremity DVT for which she was treated with 6 months of  Coumadin starting 15 months ago who presented with acute onset, right  lower extremity swelling and pain since the morning of admission and was  found to have a DVT by Doppler ultrasound.   DISCHARGE LABORATORIES:  Hemoglobin 12.6, white blood cell count 5.3,  platelet count 195, and was stable after 2 days of Lovenox.  INR on  discharge is 1.1, and it had been 1.0 on admission.  Sodium 137,  potassium 3.6, chloride 104, bicarb 26, BUN 14, creatinine 0.7, glucose  90, calcium 9.4, bili 0.5, albumin 3.9, AST 16, ALT 14, alk phos 53.   HOSPITAL COURSE:  1. Right lower extremity DVT.  As noted previously, patient had      swelling and was found to have a DVT in her right lower extremity      that went to 7 inches proximal to the knee, down into the popliteal      and calf veins.  She was started on Lovenox 20 mg/kg b.i.d. which      the patient weighed  83 kg.  She did not have any trauma or any      precipitating events, including travel or stasis, other than her      history of antiphospholipid antibody syndrome and prothrombin II      gene mutation.  She had no signs or symptoms of pulmonary embolus      throughout her stay, and her vital signs were stable.  The patient      was started on Coumadin 7.5 mg daily and had received 2 doses with      her INR going from 1.0 on admission to 1.1 on discharge.  Her      platelet count was stable at 195 and was not decreasing throughout      the hospital stay.  She was given Coumadin teaching and also      visualized using the Lovenox, and she has done this before.  She  was given T.E.D. hose because some superficial venous stasis was      noted, and this will help with that and with swelling.  She will be      followed up at the [facility] to monitor her INR      and adjust her Coumadin.  Dr. Darnelle Catalan was called and made aware of      the patient's DVT.  As this is her second one, she will need to be      on lifelong Coumadin.  He agreed with the plan and recommended that      patient follow up with the [facility] to have her INR      monitored and keep the level between 2 and 3 and also gave      information on if she wanted a second opinion to see Dr. Kathrin Ruddy at [facility].  The patient denied wanting this.  She was discharged      on 80 mg Lovenox samples to be taken b.i.d. until we see her again      in clinic on Friday.  I will be paged with her INR and adjust as      appropriate.  Her old Coumadin dose was 5 mg daily for about 3      months, and then for the following 3 months, she was on a regimen      of 5 mg alternating with 7.5 mg.  I am not completely aware of how      this was done.  2. Headache, thought to be related to tension, though patient states      that heat tends to precipitate her headaches, and she felt like the      room was warm.   She was placed on Tylenol and Percocet which      decreased her pain.  She did not have a headache within the 24      hours prior to discharge.  She did not have any neurologic signs or      symptoms either.  3. Social:  Social work was consulted to discuss finances with the      patient and to provide her with information on finding backup for      Medicaid so she can get coverage for her Coumadin and INR checks.      The patient stated that she is considering switching her job to one      where she can get insurance coverage.   DISCHARGE MEDICATIONS AND INSTRUCTIONS:  1. Coumadin 7.5 mg daily.  2. Lovenox 80 mg injected b.i.d. until Coumadin is therapeutic.  3. Tylenol 325 mg 1-2 tabs q.4 hours p.r.n. pain.  4. Percocet 5/325 one to 2 tabs q.6 hours p.r.n. severe pain.  Patient      was instructed to not take Percocet while driving and to not drink      while on Percocet.  She was also instructed to take a maximum of 12      pain pills in 24 hours because extending this would be more than      4000 mg of Tylenol in a 24-hour period leading to liver toxicity.   Patient to use T.E.D. hose or stockings to her lower legs to help with  superficial blood flow.  She is also to call MD for any chest pain,  shortness of breath, or other concerns.   FOLLOWUP:  1. The  patient is to follow up at the [facility] on Friday, December 07, for an INR and CBC checks to insure      her platelet count is not decreasing.  2. Patient instructed to follow up with Dr. Beverely Low in about 2 weeks      for hospital followup.   CONDITION:  Good, improved.   ISSUES FOR FOLLOWUP:  1. Adjust Coumadin dose to keep INR therapeutic.  2. Assess whether patient will need to be on Lovenox throughout the      weekend after being checked on Friday.  3. Determine how often patient needs to be seen in followup for INR      checks in the short-term. 4. Patient will need to Coumadin for  lifelong treatment, and she is      aware of this.           ______________________________  Norton Blizzard, M.D.     SH/MEDQ  D:  02/24/2006  T:  02/25/2006  Job:  [id number]   cc:   Neena Rhymes, M.D.  [name], M.D.

## 2010-08-08 NOTE — Discharge Summary (Signed)
Valerie Higgins, Valerie Higgins                 ACCOUNT NO.:  [id number]   MEDICAL RECORD NO.:  [id number]          PATIENT TYPE:  INP   LOCATION:  9307                          [facility]ACILITY:  [facility]   PHYSICIAN:  Crist Fat. Rivard, M.D. DATE OF BIRTH:  12/13/1980   DATE OF ADMISSION:  11/04/2004  DATE OF DISCHARGE:  11/06/2004                                 DISCHARGE SUMMARY   DISCHARGE DIAGNOSIS:  1.  Left lower extremity deep venous thrombosis.  2.  Antiphospholipid syndrome.  3.  2 weeks postpartum.   HISTORY OF PRESENT ILLNESS:  Ms. Milhouse is a 30 year old female gravida 2,  para 1-0-1-1 who was admitted 2 weeks postpartum for management of a deep  venous thrombosis in the peroneal vein throughout the calf and left leg. The  patient had an uncomplicated vaginal delivery on October 21, 2004 following an  induction at 85 - 2.7th's weeks for antiphospholipid syndrome and  oligohydramnios. Please see the patient's dictated history and physical  examination for details.   PHYSICAL EXAMINATION ON ADMISSION:  Vital signs were stable. The patient was  afebrile. General exam was within normal limits. Pelvic exam was deferred.  Extremities: The patient had deep tendon reflexes which were 1 to 2+ without  clonus bilaterally. There was a trace of edema noted bilaterally. The right  leg there was a negative Homans' sign. The left leg did have a positive  Homans' sign. There were positive pulses in both lower extremities.   HOSPITAL COURSE:  On the day of admission the patient was begun on the  Lovenox 1 mg/kg subcu every 12 hours. She also received a hematology  consultation by [name], M.D.  The patient was then started on  Coumadin 5 milligrams daily to accompany her Lovenox dose of 120 milligrams  subcutaneously daily. By hospital day #3 the patient was doing well with her  pain adequately controlled with Percocet. Her discharge PT was 12.3 with an  INR of 0.9.   DISCHARGE MEDICATIONS:  1.   Coumadin 5 milligrams at 6:00 p.m. daily  2.  Percocet one to two tablets every 4 hours as needed for pain  3.  Prenatal vitamins 1 tablet daily  4.  Lovenox 120 milligrams subcutaneously every 24 hours.   FOLLOW-UP:  The patient has an appointment on November 10, 2004, at 1 p.m.  with Dr. Darnelle Catalan for further management of her anticoagulation therapy. The  patient was advised to keep her postpartum appointment with Dr. Su Hilt as  previously scheduled.   DISCHARGE INSTRUCTIONS:  The patient was advised to call us if there was any  difficulty with bleeding in particular the injection site, her gums, etc.  She was further advised to increase her activities slowly that she may walk  up steps. She may shower and bathe. Diet was without restriction.      [place]mira J. Adline Peals.      Crist Fat Rivard, M.D.  Electronically Signed    EJP/MEDQ  D:  12/22/2004  T:  12/23/2004  Job:  [id number]

## 2010-08-08 NOTE — Consult Note (Signed)
NAMEFATEN, FRIESON                 ACCOUNT NO.:  [id number]   MEDICAL RECORD NO.:  [id number]          PATIENT TYPE:  INP   LOCATION:  9307                          [facility]AC[facility]   PHYSICIAN:  Valerie Higgins, M.D.DATE OF BIRTH:  07/25/1980   DATE OF CONSULTATION:  11/04/2004  DATE OF DISCHARGE:                                   CONSULTATION   Valerie Higgins is a 30 year old [place] Woman with a history of  antiphospholipid syndrome apparently diagnosed through screening lab work  obtained April of this year.  She was started on daily aspirin under the  care of Valerie Higgins of the [facility] and did well through her  pregnancy, with induction at 37-1/2 weeks (October 21, 2004).   The patient developed left lower extremity pain several days ago.  She  stopped taking her aspirin about 2 days ago.  She was evaluated today for  left lower extremity tenderness and erythema and Doppler ultrasound showed a  DVT in the peroneal vein throughout the calf with no superficial thrombosis  or Baker's cyst and no evidence of DVT on the right.  The patient was  admitted by Dr. Estanislado Higgins and started on b.i.d. therapeutic dose Lovenox.   PAST MEDICAL HISTORY:  Aside from the antiphospholipid syndrome is  significant for childhood asthma, history of oligohydramnios, history of a  positive RPR which was negative on confirmatory testing.   FAMILY HISTORY:  The patient's father is 62.  He has had an unexplained  blood clot and apparently is in the process of being evaluated for  hypocoagulability.  The patient's mother is 49, she does not have a history  of clotting or miscarriages.  The patient has one brother who is in good  health.   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  Her husband,  Valerie Higgins, is present today.  He works for [employer]  Their son, Valerie Higgins, now 56 weeks old, is present as well.   GYNECOLOGIC HISTORY:  She is gravida  2, para 1-0-1-1. She received Depo-  Provera on October 23, 2004.   REVIEW OF SYSTEMS:  Currently, she is pain-free.  She denies cough,  unproductive pleurisy, shortness of breath, or a catch in her breath.  There  is no chest pain or pressure, no history of palpitations.  In short, nothing  to suggest a pulmonary embolus.  She denies unusual headaches, visual  changes, cough, nausea and vomiting, or change in bowel or bladder habits.   MEDICATIONS:  1.  Lovenox.  2.  Prenatal vitamins.  3.  Pain medications p.r.n.   PHYSICAL EXAMINATION:  GENERAL:  A young white female.  VITAL SIGNS:  She is 5 feet 8 inches tall and weighs 188 pounds. Temperature  98.4, pulse 90, respirations 20, and blood pressure 129/79.  NECK:  No lymphadenopathy.  There is minimal edema in the left lower  extremity.   LABORATORY DATA:  White blood cell count 6.3, hemoglobin 12.0, MCV 99.5, and  platelets 242,000.  I do not have the  actual antiphospholipid syndrome  panel, but according to Dr. Estanislado Higgins, in April of this year, the patient  tested positive for the lupus anticoagulant and anticardiolipin antibodies  IgG and IgM.   IMPRESSION:  A 30 year old [place] woman with a left lower extremity DVT  2 weeks postpartum in the setting of the antiphospholipid antibody syndrome.  She is nursing successfully and plans to nurse a minimum of 6 months which,  of course, would be the very best for baby Valerie Higgins.   I concur with Dr Cloretta Ned plan to coumadinize Valerie Higgins under cover of  lovenox. Review of the literature on coumadin and breastfeeding (which I was  rusty on) shows coumadin does not cross into breast milk, and studies show  no deleterious effect on full-term children breastfed by coumadinized  mothers. The lovenox can be changed to 120 mg/Roe/QD to facilitate outpatient  delivery and patient's mother (studying to be an Charity fundraiser) or husband (who has DM  and injects himself with insulin regularly) should be able to  administer it  until the PT/INR is in the 2-3 range, at which time the lovenox would be  stopped.   I would anticoagulate the patient for 6 months.  She will need  anticoagulation starting the second trimester in subsequent pregnancies and  continuing through 6 weeks postpartum. I will be glad to get involved again  at that time or at any time at Dr Rivard's discretion. Otherwise at this  point no specific hematologic follow-up is planned.      Valerie Higgins, M.D.  Electronically Signed     GCM/MEDQ  D:  11/04/2004  T:  11/05/2004  Job:  [id number]   cc:   Dois Davenport A. Rivard, M.D.  Fax: [phone]
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
## 2010-08-08 NOTE — H&P (Signed)
Valerie Higgins, Valerie Higgins                 ACCOUNT NO.:  [id number]   MEDICAL RECORD NO.:  [id number]          PATIENT TYPE:  INP   LOCATION:  5711                         FACILITY:  [facility]   PHYSICIAN:  Norton Blizzard, M.D.    DATE OF BIRTH:  06-02-1980   DATE OF ADMISSION:  02/22/2006  DATE OF DISCHARGE:                              HISTORY & PHYSICAL   PRIMARY CARE PHYSICIAN:  Neena Rhymes, MD   CHIEF COMPLAINT:  Right leg swelling.   HISTORY OF PRESENT ILLNESS:  This is a 30 year old female with a history  of a left leg DVT after a pregnancy 15 months ago, history of  antiphospholipid antibody syndrome, and prothrombin II gene mutation who  presented as a walk-in at a non-clinic today with right calf swelling  and pain that she noticed when she woke up this morning.  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  She had a long car ride  three weeks ago for five hours without stopping but denies any recent  episodes of not ambulating for long periods of time or travel.  She  denies trauma or any difficulty with her left lower extremity.  The  patient was treated with Coumadin for six months after being bridged  with Lovenox for the DVT she had after her pregnancy in 2006, shortly  after her son's birth.  She had been seeing Dr. Darnelle Catalan, who followed  her on her Coumadin and treatment, but has finished seeing him as of  February.   PAST MEDICAL HISTORY:  1. History of left lower extremity DVT.  2. Obesity.  3. Antiphospholipid antibody syndrome.  4. Prothrombin II gene mutation.   PAST SURGICAL HISTORY:  1. Wisdom teeth removal.  2. Tonsillectomy and adenoidectomy.   ALLERGIES:  CODEINE, which causes hives.   MEDICATIONS:  None.   SOCIAL HISTORY:  The patient lives with her son, Valerie Higgins, who was born in  August 2006.  She has good social support from her mom.  She  occasionally uses alcohol and quit smoking when she found out she was  pregnant with her son.   She had smoked for about five years about one  pack per week.  She denies any drug use.  She works as a Child psychotherapist at  [employer].   FAMILY HISTORY:  The patient and mother deny any history of cancer or  any other diseases in parents or in her brother.   REVIEW OF SYSTEMS:  GENERAL:  The patient denies fevers, chills, or  sweats.  She complains of a runny nose that she has had.  CARDIOVASCULAR:  The patient denies chest pain.  PULMONARY:  She denies  dyspnea.  She has had a cough for about two weeks that has been  productive of some clear sputum.  GI:  She denies nausea, vomiting,  diarrhea, melena, history of PUD, or bright red blood per rectum.  GU:  Denies dysuria or difficulty urinating.  The patient has had her last  menstrual period within the last month and this was normal.  SKIN:  Denies rashes.  MUSCULOSKELETAL:  She denies joint problems, stiffness,  or swelling of any other area.   PHYSICAL EXAMINATION:  VITAL SIGNS:  Temperature 97.8, pulse 81,  respirations 18, blood pressure 109/76.  Pulse ox 97% on room air.  GENERAL:  The patient is in no acute distress and comfortable.  HEENT:  Head is atraumatic.  Extraocular movements are intact with  pupils equal, round, and reactive to light.  Tympanic membranes are  clear and glossy with good light reflect without any erythema.  She has  some cerumen in her left ear but it is not occlusive.  Her pharynx is  without erythema or exudate.  Her right pharyngeal arch is slightly  higher than her left.  LUNGS:  She had rhonchi in the left upper lobe that cleared completely  with a cough.  Otherwise, there were no rales, rhonchi, or wheezes noted  on exam.  CARDIOVASCULAR:  Regular rate and rhythm with no murmurs, rubs, or  gallops.  ABDOMEN:  Soft, nontender, nondistended.  Bowel sounds positive.  No  hepatosplenomegaly.  EXTREMITIES:  She has a swollen more firm and tender right calf compared  to the left.  She has no tenderness in  the left calf.  Her right lower  extremity in that area is more warm but there is no erythema noted on  exam.  Her pulses, including dorsalis pedis and posterior tibialis, are  2+ in both extremities.  She has no left lower extremity symptoms.  NEUROLOGIC:  Alert and oriented times three.  She moves all extremities  and her cranial nerves were grossly intact.  SKIN:  No lesions or rashes.  ADENOPATHY:  No anterior cervical, posterior cervical, supraclavicular  lymphadenopathy.   TESTS:  Doppler ultrasound showed a right occlusive DVT 7 inches above  the knee into the popliteal and calf veins.  She had collateral filling  just proximal to the DVT.   ASSESSMENT AND PLAN:  This is a 30 year old female with deep venous  thrombosis.  We will start Ms. Gaida on Lovenox 1 mg/kg b.i.d.  subcutaneously and bridge her Coumadin to be dosed by pharmacy.  Her  history is complicated only by history of antiphospholipid antibody  syndrome and prothrombin II gene mutation.  She has never had a  pulmonary emboli and denies any signs or symptoms.  There is no reason  to suspect her being pregnant at this time.  She has no real triggers  for DVT otherwise as she has been active and has not had any trauma.  We  will check an INR, CBC daily.  We will hemoccult her stool and check a  CMP.  We will write her for Tylenol as needed for pain.  We will also  write for warm compresses to be applied to the area.  She may ambulate  as tolerated in room to use the restroom.           ______________________________  Norton Blizzard, M.D.     SH/MEDQ  D:  02/22/2006  T:  02/23/2006  Job:  [id number]   cc:   Neena Rhymes, M.D.

## 2010-12-24 ENCOUNTER — Emergency Department (HOSPITAL_COMMUNITY)
Admission: EM | Admit: 2010-12-24 | Discharge: 2010-12-24 | Disposition: A | Discharge status: Home / Self Care | Payer: PRIVATE HEALTH INSURANCE | Attending: Emergency Medicine | Admitting: Emergency Medicine

## 2010-12-24 DIAGNOSIS — K089 Disorder of teeth and supporting structures, unspecified: Secondary | ICD-10-CM | POA: Insufficient documentation

## 2010-12-25 LAB — POCT I-STAT, CHEM 8
BUN: 12 mg/dL (ref 6–23)
Calcium, Ion: 1.13 mmol/L (ref 1.12–1.32)
Creatinine, Ser: 1 mg/dL (ref 0.4–1.2)
Hemoglobin: 12.6 g/dL (ref 12.0–15.0)
TCO2: 25 mmol/L (ref 0–100)

## 2010-12-25 LAB — DIFFERENTIAL
Basophils Absolute: 0 10*3/uL (ref 0.0–0.1)
Basophils Relative: 1 % (ref 0–1)
Eosinophils Relative: 3 % (ref 0–5)
Lymphocytes Relative: 38 % (ref 12–46)
Monocytes Absolute: 0.7 10*3/uL (ref 0.1–1.0)
Neutro Abs: 2.9 10*3/uL (ref 1.7–7.7)

## 2010-12-25 LAB — CBC
HCT: 36.4 % (ref 36.0–46.0)
Hemoglobin: 12.3 g/dL (ref 12.0–15.0)
MCHC: 33.8 g/dL (ref 30.0–36.0)
Platelets: 225 10*3/uL (ref 150–400)
RDW: 11.6 % (ref 11.5–15.5)

## 2010-12-25 LAB — D-DIMER, QUANTITATIVE: D-Dimer, Quant: 0.25 ug/mL-FEU (ref 0.00–0.48)

## 2011-04-24 ENCOUNTER — Other Ambulatory Visit: Payer: Self-pay | Admitting: Family Medicine

## 2011-07-15 ENCOUNTER — Ambulatory Visit (INDEPENDENT_AMBULATORY_CARE_PROVIDER_SITE_OTHER): Payer: PRIVATE HEALTH INSURANCE | Admitting: Advanced Practice Midwife

## 2011-07-15 ENCOUNTER — Encounter: Payer: Self-pay | Admitting: Advanced Practice Midwife

## 2011-07-15 VITALS — BP 117/72 | HR 102 | Temp 97.9°F | Ht 68.0 in | Wt 146.9 lb

## 2011-07-15 DIAGNOSIS — Z01812 Encounter for preprocedural laboratory examination: Secondary | ICD-10-CM

## 2011-07-15 DIAGNOSIS — Z30430 Encounter for insertion of intrauterine contraceptive device: Secondary | ICD-10-CM | POA: Insufficient documentation

## 2011-07-15 DIAGNOSIS — D6861 Antiphospholipid syndrome: Secondary | ICD-10-CM | POA: Insufficient documentation

## 2011-07-15 DIAGNOSIS — Z3049 Encounter for surveillance of other contraceptives: Secondary | ICD-10-CM

## 2011-07-15 DIAGNOSIS — Z30433 Encounter for removal and reinsertion of intrauterine contraceptive device: Secondary | ICD-10-CM

## 2011-07-15 MED ORDER — LEVONORGESTREL 20 MCG/24HR IU IUD
INTRAUTERINE_SYSTEM | Freq: Once | INTRAUTERINE | Status: AC
  Administered 2011-07-15: 1 via INTRAUTERINE

## 2011-07-15 NOTE — Patient Instructions (Signed)

## 2011-07-15 NOTE — Progress Notes (Signed)
  Subjective:    Patient ID: Valerie Higgins, female    DOB: [birth year], 31 y.o.   MRN: [id number]  HPI This is a 31 y.o. female who presents for removal of her Mirena IUD and Insertion of a new Mirena.  Her last one was inserted in July of 2008.  Her medical history is remarkable for coagulation disorder, thus making estrogen methods non-feasible. She has had no problems with her IUD and looks forward to having another.   Review of Systems  Constitutional: Negative for fever and activity change.  Genitourinary: Negative for menstrual problem and pelvic pain.       Objective:   Physical Exam  Constitutional: She is oriented to person, place, and time. She appears well-developed and well-nourished. No distress.  HENT:  Head: Normocephalic.  Cardiovascular: Normal rate.   Pulmonary/Chest: Effort normal.  Abdominal: Soft. She exhibits no distension. There is no tenderness.  Genitourinary: Vagina normal and uterus normal. No vaginal discharge found.       Cervix closed, no lesions Uterus small, midplane  Musculoskeletal: Normal range of motion.  Neurological: She is alert and oriented to person, place, and time.  Skin: Skin is warm and dry.  Psychiatric: She has a normal mood and affect.      Assessment & Plan:  IUD REMOVAL: Speculum inserted.  IUD strings visible. IUD removed without difficulty.    IUD Insertion Procedure Note  Pre-operative Diagnosis: V25.13                                              Desires Mirena IUD replacement  Post-operative Diagnosis: same  Indications: contraception  Procedure Details  Urine pregnancy test was done  and result was Negative.  The risks (including infection, bleeding, pain, and uterine perforation) and benefits of the procedure were explained to the patient and Written informed consent was obtained.    Cervix cleansed with Betadine. Uterus sounded to 5 cm. IUD inserted without difficulty. String visible and trimmed. Patient tolerated  procedure well.  IUD Information: Mirena.  Condition: Stable  Complications: None  Plan:  The patient was advised to call for any fever or for prolonged or severe pain or bleeding. She was advised to use OTC ibuprofen as needed for mild to moderate pain.   Needs Annual exam and Pap.  Pt agrees to make appointment for that.

## 2013-03-21 ENCOUNTER — Emergency Department (HOSPITAL_COMMUNITY)
Admission: EM | Admit: 2013-03-21 | Discharge: 2013-03-22 | Discharge status: Against Medical Advice | Payer: PRIVATE HEALTH INSURANCE | Attending: Emergency Medicine | Admitting: Emergency Medicine

## 2013-03-21 DIAGNOSIS — F1010 Alcohol abuse, uncomplicated: Secondary | ICD-10-CM | POA: Insufficient documentation

## 2013-03-21 DIAGNOSIS — S01501A Unspecified open wound of lip, initial encounter: Secondary | ICD-10-CM | POA: Insufficient documentation

## 2013-03-21 DIAGNOSIS — Z8742 Personal history of other diseases of the female genital tract: Secondary | ICD-10-CM | POA: Insufficient documentation

## 2013-03-21 DIAGNOSIS — Z7982 Long term (current) use of aspirin: Secondary | ICD-10-CM | POA: Insufficient documentation

## 2013-03-21 DIAGNOSIS — S0510XA Contusion of eyeball and orbital tissues, unspecified eye, initial encounter: Secondary | ICD-10-CM | POA: Insufficient documentation

## 2013-03-21 DIAGNOSIS — F909 Attention-deficit hyperactivity disorder, unspecified type: Secondary | ICD-10-CM | POA: Insufficient documentation

## 2013-03-21 DIAGNOSIS — Z79899 Other long term (current) drug therapy: Secondary | ICD-10-CM | POA: Insufficient documentation

## 2013-03-21 DIAGNOSIS — D6859 Other primary thrombophilia: Secondary | ICD-10-CM | POA: Insufficient documentation

## 2013-03-21 DIAGNOSIS — F17200 Nicotine dependence, unspecified, uncomplicated: Secondary | ICD-10-CM | POA: Insufficient documentation
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
## 2014-01-22 ENCOUNTER — Encounter: Payer: Self-pay | Admitting: Advanced Practice Midwife

## 2014-04-04 ENCOUNTER — Encounter (HOSPITAL_COMMUNITY): Payer: Self-pay

## 2014-04-04 ENCOUNTER — Emergency Department (HOSPITAL_COMMUNITY)
Admission: EM | Admit: 2014-04-04 | Discharge: 2014-04-04 | Disposition: A | Discharge status: Home / Self Care | Payer: PRIVATE HEALTH INSURANCE | Attending: Emergency Medicine | Admitting: Emergency Medicine

## 2014-04-04 DIAGNOSIS — Z79899 Other long term (current) drug therapy: Secondary | ICD-10-CM | POA: Insufficient documentation

## 2014-04-04 DIAGNOSIS — Z7982 Long term (current) use of aspirin: Secondary | ICD-10-CM | POA: Insufficient documentation

## 2014-04-04 DIAGNOSIS — Z862 Personal history of diseases of the blood and blood-forming organs and certain disorders involving the immune mechanism: Secondary | ICD-10-CM | POA: Insufficient documentation

## 2014-04-04 DIAGNOSIS — M436 Torticollis: Secondary | ICD-10-CM | POA: Insufficient documentation

## 2014-04-04 DIAGNOSIS — Z720 Tobacco use: Secondary | ICD-10-CM | POA: Insufficient documentation

## 2014-04-04 DIAGNOSIS — F909 Attention-deficit hyperactivity disorder, unspecified type: Secondary | ICD-10-CM | POA: Insufficient documentation

## 2014-04-04 MED ORDER — IBUPROFEN 800 MG PO TABS
800.0000 mg | ORAL_TABLET | Freq: Once | ORAL | Status: AC
  Administered 2014-04-04: 800 mg via ORAL
  Filled 2014-04-04: qty 1

## 2014-04-04 MED ORDER — CYCLOBENZAPRINE HCL 10 MG PO TABS: 10.0000 mg | ORAL_TABLET | Freq: Two times a day (BID) | ORAL | Status: AC | PRN

## 2014-04-04 MED ORDER — IBUPROFEN 800 MG PO TABS: 800.0000 mg | ORAL_TABLET | Freq: Three times a day (TID) | ORAL | Status: AC

## 2014-04-04 MED ORDER — CYCLOBENZAPRINE HCL 10 MG PO TABS
10.0000 mg | ORAL_TABLET | Freq: Once | ORAL | Status: AC
  Administered 2014-04-04: 10 mg via ORAL
  Filled 2014-04-04: qty 1

## 2014-04-04 NOTE — ED Notes (Signed)
Pt presents with c/o right side neck pain. Pt reports she woke up 4-5 days ago and felt like she had slept on her neck wrong. Pain has continued to increase over the last several days.

## 2014-04-04 NOTE — ED Provider Notes (Signed)
CSN: [id number]     Arrival date & time 04/04/14  1429 History  This chart was scribed for non-physician practitioner Jinny SandersJoseph Kortlynn Poust, PA-C, working with Mirian MoMatthew Gentry, MD by Littie Deedsichard Sun, ED Scribe. This patient was seen in room WTR9/WTR9 and the patient's care was started at 4:38 PM.      Chief Complaint  Patient presents with  . Neck Pain   The history is provided by the patient. No language interpreter was used.   HPI Comments: Valerie Higgins is a 34 y.o. female with a hx of anti-phospholipid syndrome who presents to the Emergency Department complaining of sudden onset, worsening, tight, shooting, right-sided neck pain with stiffness that started 5 days ago upon waking up after twisting her neck a certain way. She also reports having generalized weakness. Patient reports pain with breathing and coughing as well as neck movement. The pain has increased over the last few days and is worse every day. She thinks she may have slept on her neck wrong. Patient denies fever, dizziness, HA, sore throat, changes in vision, cough, congestion, numbness, nausea, vomiting. She denies recent illness and having similar symptoms in the past. Patient works in [employer] and was carrying trays over her head before onset of symptoms, but she does not think she overused her muscles. She has an IUD, but she states she did bleed some last month.   Past Medical History  Diagnosis Date  . Abnormal Pap smear   . Anti-phospholipid syndrome 2006  . ADHD (attention deficit hyperactivity disorder)    History reviewed. No pertinent past surgical history. No family history on file. History  Substance Use Topics  . Smoking status: Current Every Day Smoker -- 0.25 packs/day for 14 years    Types: Cigarettes  . Smokeless tobacco: Never Used  . Alcohol Use: No   OB History    Gravida Para Term Preterm AB TAB SAB Ectopic Multiple Living   3 1 1  2 1 1   1      Review of Systems  Constitutional: Negative for fever.   HENT: Negative for congestion and sore throat.   Eyes: Negative for visual disturbance.  Respiratory: Negative for cough.   Gastrointestinal: Negative for nausea and vomiting.  Musculoskeletal: Positive for neck pain and neck stiffness.  Neurological: Positive for weakness. Negative for dizziness, numbness and headaches.      Allergies  Review of patient's allergies indicates no known allergies.  Home Medications   Prior to Admission medications   Medication Sig Start Date End Date Taking? Authorizing Provider  Amphetamine-Dextroamphetamine (ADDERALL PO) Take 40 mg by mouth daily.    Historical Provider, MD  aspirin 81 MG EC tablet Take 81 mg by mouth daily.      Historical Provider, MD  cephALEXin (KEFLEX) 500 MG capsule Take 500 mg by mouth 3 (three) times daily.      Historical Provider, MD  cyclobenzaprine (FLEXERIL) 10 MG tablet Take 1 tablet (10 mg total) by mouth 2 (two) times daily as needed for muscle spasms. 04/04/14   Monte FantasiaJoseph W Abbie Berling, PA-C  ibuprofen (ADVIL,MOTRIN) 800 MG tablet Take 1 tablet (800 mg total) by mouth 3 (three) times daily. 04/04/14   Monte FantasiaJoseph W Lenin Kuhnle, PA-C  phenazopyridine (PYRIDIUM) 100 MG tablet Take 100 mg by mouth 3 (three) times daily.      Historical Provider, MD   BP 118/80 mmHg  Pulse 75  Temp(Src) 97.8 F (36.6 C) (Oral)  Resp 18  SpO2 100% Physical Exam  Constitutional:  She is oriented to person, place, and time. She appears well-developed and well-nourished. No distress.  HENT:  Head: Normocephalic and atraumatic.  Mouth/Throat: Oropharynx is clear and moist. No oropharyngeal exudate.  Eyes: Pupils are equal, round, and reactive to light.  Neck: Neck supple.  Cardiovascular: Normal rate.   Pulmonary/Chest: Effort normal.  Musculoskeletal: She exhibits tenderness. She exhibits no edema.  Pain located in upper trapezius muscle on her right side.  Neurological: She is alert and oriented to person, place, and time. She has normal strength. No  cranial nerve deficit or sensory deficit. She displays a negative Romberg sign. Coordination and gait normal. GCS eye subscore is 4. GCS verbal subscore is 5. GCS motor subscore is 6.  Patient fully alert answering questions appropriately in full, clear sentences. Cranial nerves II through XII grossly intact. Motor strength 5 out of 5 in all major muscle groups of upper and lower extremity. Distal sensation intact.  Skin: Skin is warm and dry. No rash noted.  Psychiatric: She has a normal mood and affect. Her behavior is normal.  Nursing note and vitals reviewed.   ED Course  Procedures  DIAGNOSTIC STUDIES: Oxygen Saturation is 100% on room air, normal by my interpretation.    COORDINATION OF CARE: 4:48 PM-Discussed treatment plan which includes heat, work note with pt at bedside and pt agreed to plan.    Labs Review Labs Reviewed - No data to display  Imaging Review No results found.   EKG Interpretation None      MDM   Final diagnoses:  Torticollis    Patient here with right-sided neck pain 4-5 days after sudden onset with her waking up. 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 Patient has tenderness and tightness to the upper right trapezius muscle. Patient afebrile, denies headache. Neuro exam benign. No concern for meningitis, no concern for cervical injury. Patient's pain most likely due to torticollis. Will encourage NSAIDs, muscle relaxer, heating pad, range of motion exercises and conservative therapies. Encouraged follow-up with primary care provider and given resource guide if pain persists. I discussed return precautions with patient, and patient verbalizes understanding and agreement of this plan. I encouraged patient to call or return to the ER should she have any questions or concerns.  I personally performed the services described in this documentation, which was scribed in  my presence. The recorded information has been reviewed and is accurate.  BP 118/80 mmHg  Pulse 75  Temp(Src) 97.8 F (36.6 C) (Oral)  Resp 18  SpO2 100%  Signed,  Ladona Mow, PA-C 11:11 PM    Monte Fantasia, PA-C 04/12/14 2311  Mirian Mo, MD 04/13/14 [phone]

## 2014-04-04 NOTE — Discharge Instructions (Signed)
Follow up with the wellness clinic, or refer to the resource guide below to help you find a primary care provider. Return to the ER if he develop worsening of pain or stiffness, high fever, severe headache, blurred vision, change in vision, nausea, vomiting.   Torticollis, Acute You have suddenly (acutely) developed a twisted neck (torticollis). This is usually a self-limited condition. CAUSES  Acute torticollis may be caused by malposition, trauma or infection. Most commonly, acute torticollis is caused by sleeping in an awkward position. Torticollis may also be caused by the flexion, extension or twisting of the neck muscles beyond their normal position. Sometimes, the exact cause may not be known. SYMPTOMS  Usually, there is pain and limited movement of the neck. Your neck may twist to one side. DIAGNOSIS  The diagnosis is often made by physical examination. X-rays, CT scans or MRIs may be done if there is a history of trauma or concern of infection. TREATMENT  For a common, stiff neck that develops during sleep, treatment is focused on relaxing the contracted neck muscle. Medications (including shots) may be used to treat the problem. Most cases resolve in several days. Torticollis usually responds to conservative physical therapy. If left untreated, the shortened and spastic neck muscle can cause deformities in the face and neck. Rarely, surgery is required. HOME CARE INSTRUCTIONS   Use over-the-counter and prescription medications as directed by your caregiver.  Do stretching exercises and massage the neck as directed by your caregiver.  Follow up with physical therapy if needed and as directed by your caregiver. SEEK IMMEDIATE MEDICAL CARE IF:   You develop difficulty breathing or noisy breathing (stridor).  You drool, develop trouble swallowing or have pain with swallowing.  You develop numbness or weakness in the hands or feet.  You have changes in speech or vision.  You have  problems with urination or bowel movements.  You have difficulty walking.  You have a fever.  You have increased pain. MAKE SURE YOU:   Understand these instructions.  Will watch your condition.  Will get help right away if you are not doing well or get worse. Document Released: 03/06/2000 Document Revised: 06/01/2011 Document Reviewed: 04/17/2009 [facility] Patient Information 2015 [place], [place]. This information is not intended to replace advice given to you by your health care provider. Make sure you discuss any questions you have with your health care provider.   Emergency Department Resource Guide 1) Find a Doctor and Pay Out of Pocket Although you won't have to find out who is covered by your insurance plan, it is a good idea to ask around and get recommendations. You will then need to call the office and see if the doctor you have chosen will accept you as a new patient and what types of options they offer for patients who are self-pay. Some doctors offer discounts or will set up payment plans for their patients who do not have insurance, but you will need to ask so you aren't surprised when you get to your appointment.  2) Contact Your Local Health Department Not all health departments have doctors that can see patients for sick visits, but many do, so it is worth a call to see if yours does. If you don't know where your local health department is, you can check in your phone book. The CDC also has a tool to help you locate your state's health department, and many state websites also have listings of all of their local health departments.  3)  Find a Walk-in Clinic If your illness is not likely to be very severe or complicated, you may want to try a walk in clinic. These are popping up all over the country in pharmacies, drugstores, and shopping centers. They're usually staffed by nurse practitioners or physician assistants that have been trained to treat common illnesses and  complaints. They're usually fairly quick and inexpensive. However, if you have serious medical issues or chronic medical problems, these are probably not your best option.  No Primary Care Doctor: - Call Health Connect at  [phone] - they can help you locate a primary care doctor that  accepts your insurance, provides certain services, etc. - Physician Referral Service- [phone]  Chronic Pain Problems: Organization         Address  Phone   Notes  [facility]  [phone] Patients need to be referred by their primary care doctor.   Medication Assistance: Organization         Address  Phone   Notes  [facility] Medication [facility] [address] [place] [place], [place] [address] [phone] --Must be a resident of [facility] -- Must have NO insurance coverage whatsoever (no Medicaid/ Medicare, etc.) -- The pt. MUST have a primary care doctor that directs their care regularly and follows them in the community   [facility]ist  [phone]   [employer]  [phone]    Agencies that provide inexpensive medical care: Organization         Address  Phone   Notes  [facility]  [phone]   [facility]    [phone]   [facility] [address] [place], [place] [address] [phone]   [facility] [address] [place] [phone]   Planned Parenthood    [phone]   [facility]    [phone]   [facility]  [address], [place] Phone:  [phone], Fax:  [phone] Hours of Operation:  9 am - 6 pm, M-F.  Also accepts Medicaid/Medicare and self-pay.  [facility]  [address], [place] Phone: [phone], Fax: [phone]. Hours of Operation:  8:30 am - 5:30 pm, M-F.  Also accepts Medicaid and self-pay.  [facility] [place] [address] [place] Phone: [phone]   [facility] [address] Natasha Bence [place], [place] [phone], Ext. [phone] Mondays & Thursdays: 7-9 AM.  First 15 patients are seen on a first come, first serve basis.    Medicaid-accepting [facility] Providers:  Organization         Address  Phone   Notes  [facility] [address], [place] [phone] Also accepts self-pay patients.  [facility] [address] Laurell Josephs [place], [place]  [phone]   [facility] [address], [place] [phone]   [facility] [address], [place] [phone]   Renaye Rakers [address], [place]   [phone] Only accepts [place] patients after they have their name applied to their card.   Self-Pay (no insurance) in [facility]:  Organization         Address  Phone   Notes  Sickle Cell Patients, [facility] [address], [place] [phone]   [facility] [address] [place], [place] [phone]   [facility] [place]  [address], [place] [phone]   [facility]/Dr. Osei-Bonsu  [address], [place] or [address], [place] [phone] Phone number for both [place] and [place] locations is the same.  [facility] [address] [place] [phone]   [facility] [address], [place] or [address] [phone]   [facility] [address] [place] [phone], phone; [phone], fax Sees patients 1st and 3rd Saturday of every month.  Must not qualify for public or private insurance (i.e. Medicaid, Medicare, [place] Health Choice, Veterans' Benefits)  Household income should be no more than 200% of the poverty level  The clinic cannot treat you if you are pregnant or think you are pregnant  Sexually transmitted diseases are not treated at the clinic.    Dental Care: Organization         Address  Phone  Notes  [facility] Department of [facility] [place] [facility] [address] [place], [place] [phone] Accepts children up to age 55 who are enrolled in [place] or [place] Health Choice; pregnant women with a Medicaid card; and children who have applied for Medicaid or [place] Health Choice, but were declined, whose parents can pay a reduced fee at time of service.  [facility] Department of [facility]  [address], [place] [phone] Accepts children up to age 53 who are enrolled in [place] or [place] Health Choice; pregnant women with a Medicaid card; and children who have applied for Medicaid or [place] Health Choice, but were declined, whose parents can pay a reduced fee at time of service.  [facility]  [address] [place], [place] [phone] Patients are seen by appointment only. Walk-ins are not accepted. [facility] will see patients 14 years of age and older. Monday - Tuesday (8am-5pm) Most Wednesdays (8:30-5pm) $30 per visit, cash only  [facility]  [address], [facility] [phone] Patients are seen by appointment only. Walk-ins are not accepted. [facility] will see patients 63 years of age and older. One Wednesday Evening (Monthly: Volunteer Based).  $30 per visit, cash only  [employer]  [phone] for adults; Children under age 59, call Graduate Pediatric Dentistry at [phone]. Children aged 74-14, please call [phone] to request a pediatric application.  Dental services are provided in all areas of dental care including fillings, crowns and bridges, complete and partial dentures, implants, gum treatment, root canals, and extractions. Preventive care is  also provided. Treatment is provided to both adults and children. Patients are selected via a lottery and there is often a waiting list.   [facility] [address] [place]  [phone] [email or web address]   [facility] [address] [place], [place] [phone], Ext. [phone] Second and Fourth Thursday of each month, opens at 6:30 AM; Clinic ends at 9 AM.  Patients are seen on a first-come first-served basis, and a limited number are seen during each clinic.   [facility]  [address] Ether Griffins [place], [place] [phone]   Eligibility Requirements You must have lived in [place], [place],  or [place] counties for at least the last three months.   You cannot be eligible for state or federal sponsored [place] Cityhealthcare insurance, including CIG[employer]eterans Administration, [place]icaid, or [employer]icare.   You generally cannot be eligible for healthcare insurance through your employer.    How to apply: Eligibility screenings are held every Tuesday and Wednesday afternoon from 1:00 pm until 4:00 pm. You do not need an appointment for the interview!  [facility] [address], [place], [place] [phone]   [facility]  [phone]   [facility]  [phone]   [facility]  [phone]    Behavioral Health Resources in the Community: Intensive Outpatient Programs Organization         Address  Phone  Notes  [facility] [address], [place], [place] [phone]   [facility] [address], [place], [place] [phone]   [facility] [address], [place], [place]  [phone]   [facility] [address],  [place], [place] [phone] or [phone]   Substance Abuse Resources Organization         Address  Phone  Notes  [facility]  [phone]   [facility]  [phone]   [facility]  [phone]   [facility]  [phone]   Residential & Outpatient Substance Abuse Program  [phone]   Psychological Services Organization         Address  Phone  Notes  [facility]  [phone]   [facility]  [phone]- [phone]   [facility] [address], [place] [phone] or [phone]    Mobile Crisis Teams Organization         Address  Phone  Notes  [facility], Mobile Crisis Care Unit  [phone]   [facility]  [address] [place], [place] [phone]   Doristine LocksSharon DeEsch [address] [place] [phone]    Self-Help/Support Groups Organization         Address  Phone             Notes  [facility] [place] - variety of support groups  336- [phone] Call for more information  Narcotics Anonymous (NA), [facility] [address], [place]  2 meetings at this location   Statisticianesidential Treatment Programs Organization         Address  Phone  Notes  [facility] [address],    [place]  [phone]   [facility]  [address], [place] [phone], [place], [place] [phone]   [facility] [address], [place] [phone] Admissions: 8am-3pm M-F  [facility] [address]    [place], [place] [phone]   [facility] [address], [place], [place] [phone]   [facility] [address]  [place], [place] [phone]   [facility] - Intensive Outpatient [address], [place], [place] [phone]   [facility] ([facility] [address]  [place], [place] [phone] or [phone]   [facility] ([facility]) [address] [place], [place] [phone] Accepts Medicaid  [facility] [address]  [place] [phone]  Substance Abuse/Addiction Treatment   [facility] Resources Organization         Address  Phone  Notes  [facility]  [phone]   Domenic Schwab, PhD [address] Arlis Porta [place], [place]   [phone] or [phone]   [place], [place] [phone]   [address], [place], [place] [phone] Insurance/Medicaid/sponsorship through [facility] [address]                                    [place], [place] [phone] [place] [address], [place] [phone]    Dr. Adele Schilder  [phone]   [facility] Dept. 1) [address], [place] 2) [place] 3)  [address], [place] [phone]  [phone]   [place] [phone] or [phone] (After Hours)

## 2014-05-11 ENCOUNTER — Encounter (HOSPITAL_COMMUNITY): Payer: Self-pay | Admitting: Emergency Medicine

## 2014-05-11 ENCOUNTER — Emergency Department (INDEPENDENT_AMBULATORY_CARE_PROVIDER_SITE_OTHER)
Admission: EM | Admit: 2014-05-11 | Discharge: 2014-05-11 | Disposition: A | Discharge status: Home / Self Care | Payer: Self-pay | Source: Home / Self Care | Attending: Family Medicine | Admitting: Family Medicine

## 2014-05-11 DIAGNOSIS — R21 Rash and other nonspecific skin eruption: Secondary | ICD-10-CM

## 2014-05-11 MED ORDER — PREDNISONE 10 MG PO TABS: ORAL_TABLET | ORAL | Status: AC

## 2014-05-11 MED ORDER — HYDROXYZINE HCL 50 MG PO TABS: 50.0000 mg | ORAL_TABLET | Freq: Four times a day (QID) | ORAL | Status: AC | PRN

## 2014-05-11 NOTE — Discharge Instructions (Signed)

## 2014-05-11 NOTE — ED Provider Notes (Signed)
CSN: [id number]     Arrival date & time 05/11/14  1319 History   First MD Initiated Contact with Patient 05/11/14 1430     Chief Complaint  Patient presents with  . Rash   (Consider location/radiation/quality/duration/timing/severity/associated sxs/prior Treatment) HPI         34 year old female presents for evaluation of a rash. Present for about one week. It is extremely itchy. She denies any shortness of breath, swelling of lips or tongue. She denies any known allergens.  Past Medical History  Diagnosis Date  . Abnormal Pap smear   . Anti-phospholipid syndrome 2006  . ADHD (attention deficit hyperactivity disorder)    No past surgical history on file. No family history on file. History  Substance Use Topics  . Smoking status: Current Every Day Smoker -- 0.25 packs/day for 14 years    Types: Cigarettes  . Smokeless tobacco: Never Used  . Alcohol Use: No   OB History    Gravida Para Term Preterm AB TAB SAB Ectopic Multiple Living   3 1 1  2 1 1   1      Review of Systems  Skin: Positive for rash.  All other systems reviewed and are negative.   Allergies  Review of patient's allergies indicates no known allergies.  Home Medications   Prior to Admission medications   Medication Sig Start Date End Date Taking? Authorizing Provider  Amphetamine-Dextroamphetamine (ADDERALL PO) Take 40 mg by mouth daily.   Yes Historical Provider, MD  aspirin 81 MG EC tablet Take 81 mg by mouth daily.      Historical Provider, MD  cephALEXin (KEFLEX) 500 MG capsule Take 500 mg by mouth 3 (three) times daily.      Historical Provider, MD  cyclobenzaprine (FLEXERIL) 10 MG tablet Take 1 tablet (10 mg total) by mouth 2 (two) times daily as needed for muscle spasms. 04/04/14   Monte FantasiaJoseph W Mintz, PA-C  hydrOXYzine (ATARAX/VISTARIL) 50 MG tablet Take 1 tablet (50 mg total) by mouth every 6 (six) hours as needed for itching. 05/11/14   Graylon GoodZachary H Jadelynn Boylan, PA-C  ibuprofen (ADVIL,MOTRIN) 800 MG tablet Take 1  tablet (800 mg total) by mouth 3 (three) times daily. 04/04/14   Monte FantasiaJoseph W Mintz, PA-C  phenazopyridine (PYRIDIUM) 100 MG tablet Take 100 mg by mouth 3 (three) times daily.      Historical Provider, MD  predniSONE (DELTASONE) 10 MG tablet 4 tabs PO QD for 4 days; 3 tabs PO QD for 3 days; 2 tabs PO QD for 2 days; 1 tab PO QD for 1 day 05/11/14   Graylon GoodZachary H Josephine Wooldridge, PA-C   BP 120/85 mmHg  Pulse 83  Temp(Src) 98 F (36.7 C) (Oral)  Resp 16  SpO2 98% Physical Exam  Constitutional: She is oriented to person, place, and time. Vital signs are normal. She appears well-developed and well-nourished. No distress.  HENT:  Head: Normocephalic and atraumatic.  Mouth/Throat: No oropharyngeal exudate.  Eyes: Conjunctivae are normal. Right eye exhibits no discharge. Left eye exhibits no discharge.  Neck: Normal range of motion. Neck supple.  Cardiovascular: Normal rate, regular rhythm and normal heart sounds.   Pulmonary/Chest: Effort normal and breath sounds normal. No respiratory distress.  Lymphadenopathy:    She has no cervical adenopathy.  Neurological: She is alert and oriented to person, place, and time. She has normal strength. Coordination normal.  Skin: Skin is warm and dry. Rash noted. Rash is urticarial (diffuse fine urticarial rash on the trunk and extremities). She is  not diaphoretic.  Psychiatric: She has a normal mood and affect. Judgment normal.  Nursing note and vitals reviewed.   ED Course  Procedures (including critical care time) Labs Review Labs Reviewed - No data to display  Imaging Review No results found.   MDM   1. Rash    Treat with prednisone and hydroxyzine. Follow-up when necessary if no improvement in a few days   Meds ordered this encounter  Medications  . predniSONE (DELTASONE) 10 MG tablet    Sig: 4 tabs PO QD for 4 days; 3 tabs PO QD for 3 days; 2 tabs PO QD for 2 days; 1 tab PO QD for 1 day    Dispense:  30 tablet    Refill:  0    Order Specific Question:   Supervising Provider    Answer:  Linna Hoff [phone]  . hydrOXYzine (ATARAX/VISTARIL) 50 MG tablet    Sig: Take 1 tablet (50 mg total) by mouth every 6 (six) hours as needed for itching.    Dispense:  20 tablet    Refill:  0    Order Specific Question:  Supervising Provider    Answer:  Bradd Canary D [[id number]]       Graylon Good, PA-C 05/11/14 1500

## 2014-05-11 NOTE — ED Notes (Signed)
Pt has a generalized rash from her chest to her legs.  Cause is unknown pt states that it has been there for at least a week.

## 2016-04-01 DIAGNOSIS — F909 Attention-deficit hyperactivity disorder, unspecified type: Secondary | ICD-10-CM | POA: Insufficient documentation

## 2016-04-01 DIAGNOSIS — N390 Urinary tract infection, site not specified: Secondary | ICD-10-CM | POA: Insufficient documentation

## 2016-04-01 DIAGNOSIS — Z79899 Other long term (current) drug therapy: Secondary | ICD-10-CM | POA: Insufficient documentation

## 2016-04-01 DIAGNOSIS — F17210 Nicotine dependence, cigarettes, uncomplicated: Secondary | ICD-10-CM | POA: Insufficient documentation

## 2016-04-02 ENCOUNTER — Emergency Department (HOSPITAL_COMMUNITY): Payer: Self-pay

## 2016-04-02 ENCOUNTER — Encounter (HOSPITAL_COMMUNITY): Payer: Self-pay | Admitting: Family Medicine

## 2016-04-02 ENCOUNTER — Emergency Department (HOSPITAL_COMMUNITY)
Admission: EM | Admit: 2016-04-02 | Discharge: 2016-04-02 | Disposition: A | Discharge status: Home / Self Care | Payer: Self-pay | Attending: Emergency Medicine | Admitting: Emergency Medicine

## 2016-04-02 DIAGNOSIS — N390 Urinary tract infection, site not specified: Secondary | ICD-10-CM

## 2016-04-02 DIAGNOSIS — R1031 Right lower quadrant pain: Secondary | ICD-10-CM

## 2016-04-02 LAB — COMPREHENSIVE METABOLIC PANEL
ALT: 11 U/L — ABNORMAL LOW (ref 14–54)
ANION GAP: 7 (ref 5–15)
AST: 14 U/L — AB (ref 15–41)
Albumin: 4 g/dL (ref 3.5–5.0)
Alkaline Phosphatase: 36 U/L — ABNORMAL LOW (ref 38–126)
BILIRUBIN TOTAL: 0.7 mg/dL (ref 0.3–1.2)
BUN: 13 mg/dL (ref 6–20)
CHLORIDE: 103 mmol/L (ref 101–111)
CO2: 27 mmol/L (ref 22–32)
Calcium: 8.8 mg/dL — ABNORMAL LOW (ref 8.9–10.3)
Creatinine, Ser: 0.8 mg/dL (ref 0.44–1.00)
GFR calc Af Amer: >60 mL/min (ref >60)
Glucose, Bld: 102 mg/dL — ABNORMAL HIGH (ref 65–99)
Potassium: 3.4 mmol/L — ABNORMAL LOW (ref 3.5–5.1)
Sodium: 137 mmol/L (ref 135–145)
TOTAL PROTEIN: 7.2 g/dL (ref 6.5–8.1)

## 2016-04-02 LAB — URINALYSIS, ROUTINE W REFLEX MICROSCOPIC
Bilirubin Urine: NEGATIVE
Glucose, UA: NEGATIVE mg/dL
KETONES UR: 5 mg/dL — AB
Nitrite: POSITIVE — AB
Protein, ur: 30 mg/dL — AB
Specific Gravity, Urine: 1.023 (ref 1.005–1.030)
pH: 5 (ref 5.0–8.0)

## 2016-04-02 LAB — HIV ANTIBODY (ROUTINE TESTING W REFLEX): HIV Screen 4th Generation wRfx: NONREACTIVE

## 2016-04-02 LAB — CBC
HEMATOCRIT: 34.2 % — AB (ref 36.0–46.0)
Hemoglobin: 12.3 g/dL (ref 12.0–15.0)
MCH: 33.7 pg (ref 26.0–34.0)
MCHC: 36 g/dL (ref 30.0–36.0)
MCV: 93.7 fL (ref 78.0–100.0)
Platelets: 228 10*3/uL (ref 150–400)
RBC: 3.65 MIL/uL — AB (ref 3.87–5.11)
RDW: 11.8 % (ref 11.5–15.5)
WBC: 9.1 10*3/uL (ref 4.0–10.5)

## 2016-04-02 LAB — POC URINE PREG, ED: PREG TEST UR: NEGATIVE

## 2016-04-02 LAB — LIPASE, BLOOD: LIPASE: 18 U/L (ref 11–51)

## 2016-04-02 MED ORDER — IOPAMIDOL (ISOVUE-300) INJECTION 61%
INTRAVENOUS | Status: AC
  Filled 2016-04-02: qty 100

## 2016-04-02 MED ORDER — KETOROLAC TROMETHAMINE 15 MG/ML IJ SOLN
15.0000 mg | Freq: Once | INTRAMUSCULAR | Status: AC
  Administered 2016-04-02: 15 mg via INTRAVENOUS
  Filled 2016-04-02: qty 1

## 2016-04-02 MED ORDER — CEPHALEXIN 500 MG PO CAPS: 500.0000 mg | ORAL_CAPSULE | Freq: Three times a day (TID) | ORAL | 0 refills | Status: AC

## 2016-04-02 MED ORDER — IOPAMIDOL (ISOVUE-300) INJECTION 61%
100.0000 mL | Freq: Once | INTRAVENOUS | Status: AC | PRN
  Administered 2016-04-02: 100 mL via INTRAVENOUS

## 2016-04-02 MED ORDER — SODIUM CHLORIDE 0.9 % IV BOLUS (SEPSIS)
1000.0000 mL | Freq: Once | INTRAVENOUS | Status: AC
  Administered 2016-04-02: 1000 mL via INTRAVENOUS

## 2016-04-02 MED ORDER — DEXTROSE 5 % IV SOLN
1.0000 g | Freq: Once | INTRAVENOUS | Status: AC
  Administered 2016-04-02: 1 g via INTRAVENOUS
  Filled 2016-04-02: qty 10

## 2016-04-02 NOTE — ED Notes (Signed)
Pt transported to CT ?

## 2016-04-02 NOTE — ED Provider Notes (Signed)
[facility]-EMERGENCY DEPT Provider Note   CSN: [id number] Arrival date & time: 04/01/16  2359   By signing my name below, I, Nelwyn Salisbury, attest that this documentation has been prepared under the direction and in the presence of Tilden Fossa, MD . Electronically Signed: Nelwyn Salisbury, Scribe. 04/02/2016. 3:47 AM.  History   Chief Complaint Chief Complaint  Patient presents with  . Abdominal Pain   The history is provided by the patient. No language interpreter was used.    HPI Comments:  Valerie Higgins is a 36 y.o. female who presents to the Emergency Department complaining of constant, unchanged, mild RLQ abdominal pain which began two days ago. Pt describes her symptoms as a 9/10 sharp pain that radiates across her lower stomach. She reports associated fatigue and chills. Pt has not taken anything at home for her symptoms. She denies any vaginal discharge, vomiting, or dysuria. Pt also denies any pshx on her abdomen.   Past Medical History:  Diagnosis Date  . Abnormal Pap smear   . ADHD (attention deficit hyperactivity disorder)   . Anti-phospholipid syndrome [facility]) 2006    Patient Active Problem List   Diagnosis Date Noted  . Encounter for insertion of mirena IUD 07/15/2011  . Anti-phospholipid syndrome (HCC)   . PYELONEPHRITIS, ACUTE 02/11/2010  . PRIMARY HYPERCOAGULABLE STATE 09/06/2009  . ARTHRALGIA 09/06/2009  . TOBACCO USE 01/10/2007  . FATIGUE 12/24/2006  . PHLEBITIS, DEEP VEINS, LOWER LIMB NOS 05/25/2006    History reviewed. No pertinent surgical history.  OB History    Gravida Para Term Preterm AB Living   3 1 1   2 1    SAB TAB Ectopic Multiple Live Births   1 1             Home Medications    Prior to Admission medications   Medication Sig Start Date End Date Taking? Authorizing Provider  cephALEXin (KEFLEX) 500 MG capsule Take 1 capsule (500 mg total) by mouth 3 (three) times daily. 04/02/16   Tilden Fossa, MD  cyclobenzaprine (FLEXERIL) 10 MG tablet  Take 1 tablet (10 mg total) by mouth 2 (two) times daily as needed for muscle spasms. Patient not taking: Reported on 04/02/2016 04/04/14   Ladona Mow, PA-C  hydrOXYzine (ATARAX/VISTARIL) 50 MG tablet Take 1 tablet (50 mg total) by mouth every 6 (six) hours as needed for itching. Patient not taking: Reported on 04/02/2016 05/11/14   Graylon Good, PA-C  ibuprofen (ADVIL,MOTRIN) 800 MG tablet Take 1 tablet (800 mg total) by mouth 3 (three) times daily. Patient not taking: Reported on 04/02/2016 04/04/14   Ladona Mow, PA-C  predniSONE (DELTASONE) 10 MG tablet 4 tabs PO QD for 4 days; 3 tabs PO QD for 3 days; 2 tabs PO QD for 2 days; 1 tab PO QD for 1 day Patient not taking: Reported on 04/02/2016 05/11/14   Graylon Good, PA-C    Family History History reviewed. No pertinent family history.  Social History Social History  Substance Use Topics  . Smoking status: Current Every Day Smoker    Packs/day: 0.25    Years: 14.00    Types: Cigarettes  . Smokeless tobacco: Never Used  . Alcohol use Yes     Comment: Every other day.     Allergies   Patient has no known allergies.   Review of Systems Review of Systems  Constitutional: Positive for chills and fatigue.  Gastrointestinal: Positive for abdominal pain. Negative for vomiting.  Genitourinary: Negative for dysuria and  vaginal discharge.  All other systems reviewed and are negative.    Physical Exam Updated Vital Signs BP 104/65   Pulse 82   Temp 98.9 F (37.2 C) (Oral)   Resp 18   Ht 5\' 8"  (1.727 m)   Wt 162 lb 11.2 oz (73.8 kg)   SpO2 100%   BMI 24.74 kg/m   Physical Exam  Constitutional: She is oriented to person, place, and time. She appears well-developed and well-nourished.  HENT:  Head: Normocephalic and atraumatic.  Cardiovascular: Normal rate and regular rhythm.   No murmur heard. Pulmonary/Chest: Effort normal and breath sounds normal. No respiratory distress.  Abdominal: Soft. There is tenderness. There is no  rebound and no guarding.  Moderate RLQ tenderness. No CVA tenderness.   Genitourinary:  Genitourinary Comments: Scant discharge. IUD strings in cervical os. No CMT or adnexal tenderness.  Musculoskeletal: She exhibits no edema or tenderness.  Neurological: She is alert and oriented to person, place, and time.  Skin: Skin is warm and dry.  Psychiatric: She has a normal mood and affect. Her behavior is normal.  Nursing note and vitals reviewed.    ED Treatments / Results  DIAGNOSTIC STUDIES:  Oxygen Saturation is 98% on RA, normal by my interpretation.    COORDINATION OF CARE:  3:50 AM Discussed treatment plan with pt at bedside which includes pelvic exam, abx, and imaging and pt agreed to plan.  Labs (all labs ordered are listed, but only abnormal results are displayed) Labs Reviewed  COMPREHENSIVE METABOLIC PANEL - Abnormal; Notable for the following:       Result Value   Potassium 3.4 (*)    Glucose, Bld 102 (*)    Calcium 8.8 (*)    AST 14 (*)    ALT 11 (*)    Alkaline Phosphatase 36 (*)    All other components within normal limits  CBC - Abnormal; Notable for the following:    RBC 3.65 (*)    HCT 34.2 (*)    All other components within normal limits  URINALYSIS, ROUTINE W REFLEX MICROSCOPIC - Abnormal; Notable for the following:    APPearance HAZY (*)    Hgb urine dipstick LARGE (*)    Ketones, ur 5 (*)    Protein, ur 30 (*)    Nitrite POSITIVE (*)    Leukocytes, UA MODERATE (*)    Bacteria, UA RARE (*)    Squamous Epithelial / LPF 0-5 (*)    All other components within normal limits  URINE CULTURE  LIPASE, BLOOD  RPR  HIV ANTIBODY (ROUTINE TESTING)  POC URINE PREG, ED  GC/CHLAMYDIA PROBE AMP ([place]) NOT AT [facility]    EKG  EKG Interpretation None       Radiology Ct Abdomen Pelvis W Contrast  Result Date: 04/02/2016 CLINICAL DATA:  Right lower quadrant pain for 2 days with fatigue and chills. Red cells and white cells in the urine. Negative urine  pregnancy test. EXAM: CT ABDOMEN AND PELVIS WITH CONTRAST TECHNIQUE: Multidetector CT imaging of the abdomen and pelvis was performed using the standard protocol following bolus administration of intravenous contrast. CONTRAST:  100 mL Isovue-300 COMPARISON:  CT chest 03/13/2008.  Ultrasound pelvis 10/10/2007. FINDINGS: Lower chest: No acute abnormality. Hepatobiliary: No focal liver abnormality is seen. No gallstones, gallbladder wall thickening, or biliary dilatation. Gallbladder is contracted, likely physiologic. Pancreas: Unremarkable. No pancreatic ductal dilatation or surrounding inflammatory changes. Spleen: Normal in size without focal abnormality. Adrenals/Urinary Tract: Adrenal glands are unremarkable. Kidneys are  normal, without renal calculi, focal lesion, or hydronephrosis. Bladder is unremarkable. Stomach/Bowel: Stomach is within normal limits. Appendix appears normal. No evidence of bowel wall thickening, distention, or inflammatory changes. Vascular/Lymphatic: No significant vascular findings are present. No enlarged abdominal or pelvic lymph nodes. Reproductive: Intrauterine device. Uterus and ovaries are not enlarged. Involuting cyst in the right ovary. Small amount of free fluid in the pelvis is likely physiologic. Other: No free air in the abdomen. Abdominal wall musculature appears intact. Musculoskeletal: No acute or significant osseous findings. IMPRESSION: No acute process demonstrated in the abdomen or pelvis. No evidence of bowel obstruction or inflammation. Appendix is normal. Involuting cyst in the right ovary. Small amount of physiologic free fluid in the pelvis. Intrauterine device. Electronically Signed   By: Burman Nieves M.D.   On: 04/02/2016 05:02    Procedures Procedures (including critical care time)  Medications Ordered in ED Medications  iopamidol (ISOVUE-300) 61 % injection (not administered)  sodium chloride 0.9 % bolus 1,000 mL (1,000 mLs Intravenous New Bag/Given  04/02/16 0406)  ketorolac (TORADOL) 15 MG/ML injection 15 mg (15 mg Intravenous Given 04/02/16 0406)  iopamidol (ISOVUE-300) 61 % injection 100 mL (100 mLs Intravenous Contrast Given 04/02/16 0443)     Initial Impression / Assessment and Plan / ED Course  I have reviewed the triage vital signs and the nursing notes.  Pertinent labs & imaging results that were available during my care of the patient were reviewed by me and considered in my medical decision making (see chart for details).  Clinical Course     Patient here for evaluation of right lower quadrant pain. She does have some tenderness on examination. Pelvic examination is benign with no evidence of acute pelvic pathology causing her symptoms. UA is concerning for urinary tract infection, will treat with antibiotics. Given her tenderness is CT abdomen was obtained that was negative for acute appendicitis. Discussed with patient home care for urinary tract infection as well as abdominal pain. Discussed outpatient follow-up and options.  Final Clinical Impressions(s) / ED Diagnoses   Final diagnoses:  Right lower quadrant abdominal pain  Acute UTI    New Prescriptions New Prescriptions   CEPHALEXIN (KEFLEX) 500 MG CAPSULE    Take 1 capsule (500 mg total) by mouth 3 (three) times daily.  I personally performed the services described in this documentation, which was scribed in my presence. The recorded information has been reviewed and is accurate.    Tilden Fossa, MD 04/02/16 [phone]

## 2016-04-02 NOTE — ED Triage Notes (Signed)
Patient reports she is experiencing right lower quad pain with nausea, bloating, and cramping. Symptoms started 2 days ago. Denies taking any medication for symptoms. Denies fever but reports chills.

## 2016-04-03 LAB — RPR: RPR: REACTIVE — AB

## 2016-04-03 LAB — RPR, QUANT+TP ABS (REFLEX)
Rapid Plasma Reagin, Quant: 1:1 {titer} — ABNORMAL HIGH
T Pallidum Abs: NEGATIVE

## 2016-04-04 LAB — URINE CULTURE

## 2016-04-05 ENCOUNTER — Telehealth: Payer: Self-pay

## 2016-04-05 NOTE — Telephone Encounter (Signed)
Post ED Visit - Positive Culture Follow-up  Culture report reviewed by antimicrobial stewardship pharmacist:  []  Enzo BiNathan Batchelder, Pharm.D. []  Celedonio MiyamotoJeremy Frens, Pharm.D., BCPS []  Garvin FilaMike Maccia, Pharm.D. []  Georgina PillionElizabeth Martin, Pharm.D., BCPS []  LivingstonMinh Pham, [address]Ph[address]D., BCPS, AAHIVP []  Estella HuskMichelle Turner, Pharm.D., BCPS, AAHIVP []  Cassie Stewart, [address] []  Sherle Poeob Vincent, [address]Ph[address] Casilda Carlsaylor Stone Pharm D Positive urine culture Treated with Cephalexin, organism sensitive to the same and no further patient follow-up is required at this time.  Jerry CarasCullom, Darl Kuss Burnett 04/05/2016, 9:43 AM

## 2024-03-06 ENCOUNTER — Encounter (HOSPITAL_BASED_OUTPATIENT_CLINIC_OR_DEPARTMENT_OTHER): Payer: Self-pay | Admitting: Internal Medicine
